# Patient Record
Sex: Female | Born: 1950 | Race: White | Hispanic: No | Marital: Married | Smoking: Former smoker
Health system: Southern US, Community
[De-identification: ages and names within clinical notes are randomized; demographics above are authoritative.]

## PROBLEM LIST (undated history)

## (undated) DIAGNOSIS — E119 Type 2 diabetes mellitus without complications: Secondary | ICD-10-CM

## (undated) DIAGNOSIS — H269 Unspecified cataract: Secondary | ICD-10-CM

## (undated) DIAGNOSIS — E785 Hyperlipidemia, unspecified: Secondary | ICD-10-CM

## (undated) DIAGNOSIS — B019 Varicella without complication: Secondary | ICD-10-CM

## (undated) DIAGNOSIS — D649 Anemia, unspecified: Secondary | ICD-10-CM

## (undated) DIAGNOSIS — M8580 Other specified disorders of bone density and structure, unspecified site: Secondary | ICD-10-CM

## (undated) DIAGNOSIS — R251 Tremor, unspecified: Secondary | ICD-10-CM

## (undated) DIAGNOSIS — M810 Age-related osteoporosis without current pathological fracture: Secondary | ICD-10-CM

## (undated) DIAGNOSIS — M1990 Unspecified osteoarthritis, unspecified site: Secondary | ICD-10-CM

## (undated) DIAGNOSIS — K219 Gastro-esophageal reflux disease without esophagitis: Secondary | ICD-10-CM

## (undated) HISTORY — PX: TUBAL LIGATION: SHX77

## (undated) HISTORY — DX: Type 2 diabetes mellitus without complications: E11.9

## (undated) HISTORY — DX: Gastro-esophageal reflux disease without esophagitis: K21.9

## (undated) HISTORY — DX: Unspecified cataract: H26.9

## (undated) HISTORY — DX: Unspecified osteoarthritis, unspecified site: M19.90

## (undated) HISTORY — DX: Hyperlipidemia, unspecified: E78.5

## (undated) HISTORY — DX: Tremor, unspecified: R25.1

## (undated) HISTORY — DX: Other specified disorders of bone density and structure, unspecified site: M85.80

## (undated) HISTORY — DX: Anemia, unspecified: D64.9

## (undated) HISTORY — DX: Age-related osteoporosis without current pathological fracture: M81.0

## (undated) HISTORY — DX: Varicella without complication: B01.9

---

## 1992-10-25 DIAGNOSIS — J189 Pneumonia, unspecified organism: Secondary | ICD-10-CM

## 1992-10-25 HISTORY — DX: Pneumonia, unspecified organism: J18.9

## 1999-03-27 ENCOUNTER — Ambulatory Visit (HOSPITAL_COMMUNITY): Admission: RE | Admit: 1999-03-27 | Discharge: 1999-03-27 | Payer: Self-pay | Admitting: Family Medicine

## 1999-04-09 ENCOUNTER — Encounter: Payer: Self-pay | Admitting: Family Medicine

## 1999-04-09 ENCOUNTER — Ambulatory Visit (HOSPITAL_COMMUNITY): Admission: RE | Admit: 1999-04-09 | Discharge: 1999-04-09 | Payer: Self-pay | Admitting: Family Medicine

## 1999-07-20 ENCOUNTER — Other Ambulatory Visit: Admission: RE | Admit: 1999-07-20 | Discharge: 1999-07-20 | Payer: Self-pay | Admitting: Family Medicine

## 1999-11-13 ENCOUNTER — Encounter: Payer: Self-pay | Admitting: Family Medicine

## 1999-11-13 ENCOUNTER — Ambulatory Visit (HOSPITAL_COMMUNITY): Admission: RE | Admit: 1999-11-13 | Discharge: 1999-11-13 | Payer: Self-pay | Admitting: Family Medicine

## 2000-11-08 ENCOUNTER — Other Ambulatory Visit: Admission: RE | Admit: 2000-11-08 | Discharge: 2000-11-08 | Payer: Self-pay | Admitting: Family Medicine

## 2000-11-23 ENCOUNTER — Ambulatory Visit (HOSPITAL_COMMUNITY): Admission: RE | Admit: 2000-11-23 | Discharge: 2000-11-23 | Payer: Self-pay | Admitting: Family Medicine

## 2000-11-23 ENCOUNTER — Encounter: Payer: Self-pay | Admitting: Family Medicine

## 2005-10-25 HISTORY — PX: COLONOSCOPY: SHX174

## 2005-10-25 LAB — HM COLONOSCOPY

## 2012-11-02 ENCOUNTER — Ambulatory Visit (INDEPENDENT_AMBULATORY_CARE_PROVIDER_SITE_OTHER): Payer: No Typology Code available for payment source | Admitting: Family Medicine

## 2012-11-02 ENCOUNTER — Encounter: Payer: Self-pay | Admitting: Family Medicine

## 2012-11-02 VITALS — BP 130/80 | HR 67 | Temp 97.9°F | Ht 68.75 in | Wt 213.0 lb

## 2012-11-02 DIAGNOSIS — I10 Essential (primary) hypertension: Secondary | ICD-10-CM | POA: Insufficient documentation

## 2012-11-02 DIAGNOSIS — M810 Age-related osteoporosis without current pathological fracture: Secondary | ICD-10-CM | POA: Insufficient documentation

## 2012-11-02 DIAGNOSIS — Z131 Encounter for screening for diabetes mellitus: Secondary | ICD-10-CM

## 2012-11-02 DIAGNOSIS — E785 Hyperlipidemia, unspecified: Secondary | ICD-10-CM

## 2012-11-02 DIAGNOSIS — M949 Disorder of cartilage, unspecified: Secondary | ICD-10-CM

## 2012-11-02 DIAGNOSIS — E119 Type 2 diabetes mellitus without complications: Secondary | ICD-10-CM

## 2012-11-02 DIAGNOSIS — M8580 Other specified disorders of bone density and structure, unspecified site: Secondary | ICD-10-CM

## 2012-11-02 LAB — BASIC METABOLIC PANEL
CO2: 30 mEq/L (ref 19–32)
Calcium: 9 mg/dL (ref 8.4–10.5)
Chloride: 101 mEq/L (ref 96–112)
Glucose, Bld: 103 mg/dL — ABNORMAL HIGH (ref 70–99)
Sodium: 138 mEq/L (ref 135–145)

## 2012-11-02 LAB — HEPATIC FUNCTION PANEL
ALT: 18 U/L (ref 0–35)
AST: 25 U/L (ref 0–37)
Alkaline Phosphatase: 41 U/L (ref 39–117)
Bilirubin, Direct: 0 mg/dL (ref 0.0–0.3)
Total Protein: 7.3 g/dL (ref 6.0–8.3)

## 2012-11-02 LAB — CBC WITH DIFFERENTIAL/PLATELET
Basophils Absolute: 0 10*3/uL (ref 0.0–0.1)
Eosinophils Absolute: 0.1 10*3/uL (ref 0.0–0.7)
Lymphocytes Relative: 30.5 % (ref 12.0–46.0)
MCHC: 33.5 g/dL (ref 30.0–36.0)
Monocytes Relative: 9.7 % (ref 3.0–12.0)
Neutrophils Relative %: 57 % (ref 43.0–77.0)
Platelets: 245 10*3/uL (ref 150.0–400.0)
RDW: 12.9 % (ref 11.5–14.6)

## 2012-11-02 LAB — LIPID PANEL
Total CHOL/HDL Ratio: 3
Triglycerides: 91 mg/dL (ref 0.0–149.0)

## 2012-11-02 LAB — TSH: TSH: 3.6 u[IU]/mL (ref 0.35–5.50)

## 2012-11-02 MED ORDER — PRAVASTATIN SODIUM 40 MG PO TABS: 40.0000 mg | ORAL_TABLET | Freq: Every day | ORAL | Status: DC

## 2012-11-02 MED ORDER — OMEPRAZOLE 20 MG PO CPDR: 20.0000 mg | DELAYED_RELEASE_CAPSULE | Freq: Every day | ORAL | Status: DC

## 2012-11-02 MED ORDER — VALSARTAN 80 MG PO TABS: 80.0000 mg | ORAL_TABLET | Freq: Every day | ORAL | Status: DC

## 2012-11-02 MED ORDER — METFORMIN HCL 1000 MG PO TABS: 1000.0000 mg | ORAL_TABLET | Freq: Two times a day (BID) | ORAL | Status: DC

## 2012-11-02 MED ORDER — ALENDRONATE SODIUM 70 MG PO TABS: 70.0000 mg | ORAL_TABLET | ORAL | Status: DC

## 2012-11-02 MED ORDER — ONETOUCH DELICA LANCETS 33G MISC: 1.0000 | Status: DC

## 2012-11-02 MED ORDER — GLUCOSE BLOOD VI STRP: ORAL_STRIP | Status: DC

## 2012-11-02 NOTE — Progress Notes (Signed)
  Subjective:    Patient ID: Theresa Eaton, female    DOB: 1951-03-08, 62 y.o.   MRN: [id number]  HPI New to establish.  Previous MD- Benedetto Goad, [place].  Last appt 03/07/12.  Health maintenance- last pap 2013, mammo 9/12  DM- chronic problem, on Metformin twice daily.  Last A1C 5.7 on 08/01/12.  Checking CBGs regularly- fastings recently ~120s.  No symptomatic lows.  UTD on eye exam.  No N/V/D.  No CP, SOB, HAs, visual changes, edema, numbness/tingling  HTN- chronic problem, on Diovan daily.  Asymptomatic.  Hyperlipidemia- chronic problem, on Pravastatin nightly.  Denies abd pain, N/V, myalgias.  Labs done in May.  Osteopenia- BMD decreased from -1 to -1.5 in 5 yr period (last DEXA 2012).  Now on Fosamax weekly.  Due for repeat DEXA this Feb.    Review of Systems For ROS see HPI     Objective:   Physical Exam  Vitals reviewed. Constitutional: She is oriented to person, place, and time. She appears well-developed and well-nourished. No distress.  HENT:  Head: Normocephalic and atraumatic.  Eyes: Conjunctivae normal and EOM are normal. Pupils are equal, round, and reactive to light.  Neck: Normal range of motion. Neck supple. No thyromegaly present.  Cardiovascular: Normal rate, regular rhythm, normal heart sounds and intact distal pulses.   No murmur heard. Pulmonary/Chest: Effort normal and breath sounds normal. No respiratory distress.  Abdominal: Soft. She exhibits no distension. There is no tenderness.  Musculoskeletal: She exhibits no edema.  Lymphadenopathy:    She has no cervical adenopathy.  Neurological: She is alert and oriented to person, place, and time.  Skin: Skin is warm and dry.  Psychiatric: She has a normal mood and affect. Her behavior is normal.          Assessment & Plan:

## 2012-11-02 NOTE — Patient Instructions (Addendum)
Schedule your complete physical for May We'll notify you of your lab results and make any changes if needed Call with any questions or concerns Welcome!  We're glad to have you!

## 2012-11-05 LAB — VITAMIN D 1,25 DIHYDROXY
Vitamin D 1, 25 (OH)2 Total: 51 pg/mL (ref 18–72)
Vitamin D2 1, 25 (OH)2: <8 pg/mL
Vitamin D3 1, 25 (OH)2: 51 pg/mL

## 2012-11-06 ENCOUNTER — Encounter: Payer: Self-pay | Admitting: *Deleted

## 2012-11-07 ENCOUNTER — Encounter: Payer: Self-pay | Admitting: Family Medicine

## 2012-11-07 DIAGNOSIS — R739 Hyperglycemia, unspecified: Secondary | ICD-10-CM | POA: Insufficient documentation

## 2012-11-07 DIAGNOSIS — Z8639 Personal history of other endocrine, nutritional and metabolic disease: Secondary | ICD-10-CM | POA: Insufficient documentation

## 2012-11-07 NOTE — Assessment & Plan Note (Signed)
New to provider, ongoing for pt.  On Fosamax.  Check Vit D.  Pt due for DEXA after Feb this year.  Will order at upcoming CPE.  Pt expressed understanding and is in agreement w/ plan.

## 2012-11-07 NOTE — Assessment & Plan Note (Signed)
New to provider, chronic for pt.  Tolerating statin w/out difficulty.  Check labs.  Adjust meds prn

## 2012-11-07 NOTE — Assessment & Plan Note (Signed)
Wrong diagnosis

## 2012-11-07 NOTE — Assessment & Plan Note (Signed)
New to provider, chronic for pt.  Tolerating meds, BP adequately controlled.  Asymptomatic.  Check labs.  No anticipated changes.

## 2012-11-07 NOTE — Assessment & Plan Note (Signed)
New to provider, ongoing for pt.  Tolerating meds w/out difficulty.  Last A1C was excellent.  Asymptomatic.  UTD on eye exam.  Check labs.  Adjust meds prn.

## 2012-12-09 ENCOUNTER — Other Ambulatory Visit: Payer: Self-pay

## 2013-01-02 ENCOUNTER — Telehealth: Payer: Self-pay | Admitting: Family Medicine

## 2013-01-02 NOTE — Telephone Encounter (Signed)
Plan listed at last OV was to order at time of upcoming CPE

## 2013-01-02 NOTE — Telephone Encounter (Signed)
Please advise on note.  Pt has an appt scheduled on 03-09-13.   Do you want me to go ahead and schedule the mammogram and BDS?//AB/CMA

## 2013-01-02 NOTE — Telephone Encounter (Signed)
Patient states that she had talked to Dr. Beverely Low about getting a mammogram and bone density test done, but says that no one has called her regarding this.

## 2013-01-02 NOTE — Telephone Encounter (Signed)
LM @ (5:07pm) asking the pt to RTC.//AB/CMA

## 2013-01-05 NOTE — Telephone Encounter (Signed)
Lm @ (11:33am) asking the pt to RTC.//AB/CMA

## 2013-01-05 NOTE — Telephone Encounter (Signed)
LM @ (8:25am) asking the pt to RTC.//AB/CMA

## 2013-01-09 ENCOUNTER — Telehealth: Payer: Self-pay | Admitting: *Deleted

## 2013-01-09 NOTE — Telephone Encounter (Signed)
Spoke with the pt on (01-09-13) and informed her that Dr. Beverely Low plan is to order the Mammogram and BDS at the time of her upcoming CPE.  Pt agreed.//AB/CMA

## 2013-01-09 NOTE — Telephone Encounter (Signed)
Received call on (01-05-13) from Graciela Husbands with [facility] regarding changing the pt's BP med.  She stated that the pt's ins has an preferrred plan drug list and her Diovan is not on the list.  The meds that are on the list are: Losartan,Irbesartan,Eprosartan.  Verbally informed Dr. Beverely Low of the call regarding the med change.  Dr. Beverely Low requested to change from Diovan to Losartan 100mg  1 daily.  Informed Graciela Husbands at [employer] of this change.  Also called the pt and explained to her that we received a call from [facility] regarding changing her Diovan to another med, and explained to her the new med will be Losartan 100mg  and she's to take it 1 daily.  Also told her she will need to f/u with Dr. Beverely Low in 1 mos to see how the Losartan 100mg  is working.  Pt understood and agreed.//AB/CMA

## 2013-03-09 ENCOUNTER — Ambulatory Visit (INDEPENDENT_AMBULATORY_CARE_PROVIDER_SITE_OTHER): Payer: No Typology Code available for payment source | Admitting: Family Medicine

## 2013-03-09 ENCOUNTER — Encounter: Payer: Self-pay | Admitting: Family Medicine

## 2013-03-09 VITALS — BP 120/80 | HR 63 | Temp 98.1°F | Ht 68.75 in | Wt 212.2 lb

## 2013-03-09 DIAGNOSIS — E119 Type 2 diabetes mellitus without complications: Secondary | ICD-10-CM

## 2013-03-09 DIAGNOSIS — M899 Disorder of bone, unspecified: Secondary | ICD-10-CM

## 2013-03-09 DIAGNOSIS — M8580 Other specified disorders of bone density and structure, unspecified site: Secondary | ICD-10-CM

## 2013-03-09 DIAGNOSIS — Z1211 Encounter for screening for malignant neoplasm of colon: Secondary | ICD-10-CM

## 2013-03-09 DIAGNOSIS — Z1231 Encounter for screening mammogram for malignant neoplasm of breast: Secondary | ICD-10-CM

## 2013-03-09 DIAGNOSIS — Z0000 Encounter for general adult medical examination without abnormal findings: Secondary | ICD-10-CM

## 2013-03-09 DIAGNOSIS — Z1331 Encounter for screening for depression: Secondary | ICD-10-CM

## 2013-03-09 LAB — CBC WITH DIFFERENTIAL/PLATELET
Basophils Absolute: 0 10*3/uL (ref 0.0–0.1)
Eosinophils Absolute: 0.1 10*3/uL (ref 0.0–0.7)
HCT: 37.5 % (ref 36.0–46.0)
Lymphs Abs: 1.9 10*3/uL (ref 0.7–4.0)
MCHC: 33.8 g/dL (ref 30.0–36.0)
MCV: 89 fl (ref 78.0–100.0)
Monocytes Absolute: 0.5 10*3/uL (ref 0.1–1.0)
Neutrophils Relative %: 51.1 % (ref 43.0–77.0)
Platelets: 246 10*3/uL (ref 150.0–400.0)
RDW: 13.5 % (ref 11.5–14.6)
WBC: 5.2 10*3/uL (ref 4.5–10.5)

## 2013-03-09 LAB — HEPATIC FUNCTION PANEL
AST: 19 U/L (ref 0–37)
Albumin: 3.8 g/dL (ref 3.5–5.2)
Alkaline Phosphatase: 43 U/L (ref 39–117)
Bilirubin, Direct: 0.1 mg/dL (ref 0.0–0.3)
Total Bilirubin: 0.6 mg/dL (ref 0.3–1.2)

## 2013-03-09 LAB — BASIC METABOLIC PANEL
Calcium: 8.8 mg/dL (ref 8.4–10.5)
GFR: 92.54 mL/min (ref >60.00)
Glucose, Bld: 90 mg/dL (ref 70–99)
Potassium: 3.7 mEq/L (ref 3.5–5.1)
Sodium: 139 mEq/L (ref 135–145)

## 2013-03-09 LAB — LIPID PANEL
HDL: 59.4 mg/dL (ref >39.00)
Total CHOL/HDL Ratio: 3
Triglycerides: 99 mg/dL (ref 0.0–149.0)
VLDL: 19.8 mg/dL (ref 0.0–40.0)

## 2013-03-09 NOTE — Patient Instructions (Addendum)
Follow up in 3-4 months to recheck diabetes Keep up the good work!  You look great! We'll notify you of your lab results and make any changes if needed We'll call you with your mammo and bone density appts Someone will call you with your GI appt Call with any questions or concerns Have a great summer!

## 2013-03-09 NOTE — Progress Notes (Signed)
  Subjective:    Patient ID: Theresa Eaton, female    DOB: 04/17/1951, 62 y.o.   MRN: [id number]  HPI CPE- pt unclear on date of last colonoscopy.  Due for DEXA, mammo.  UTD pap.  No concerns today.  UTD on eye exam.   Review of Systems Patient reports no vision/ hearing changes, adenopathy,fever, weight change,  persistant/recurrent hoarseness , swallowing issues, chest pain, palpitations, edema, persistant/recurrent cough, hemoptysis, dyspnea (rest/exertional/paroxysmal nocturnal), gastrointestinal bleeding (melena, rectal bleeding), abdominal pain, significant heartburn, bowel changes, GU symptoms (dysuria, hematuria, incontinence), Gyn symptoms (abnormal  bleeding, pain),  syncope, focal weakness, memory loss, numbness & tingling, skin/hair/nail changes, abnormal bruising or bleeding, anxiety, or depression.     Objective:   Physical Exam  General Appearance:    Alert, cooperative, no distress, appears stated age  Head:    Normocephalic, without obvious abnormality, atraumatic  Eyes:    PERRL, conjunctiva/corneas clear, EOM's intact, fundi    benign, both eyes  Ears:    Normal TM's and external ear canals, both ears  Nose:   Nares normal, septum midline, mucosa normal, no drainage    or sinus tenderness  Throat:   Lips, mucosa, and tongue normal; teeth and gums normal  Neck:   Supple, symmetrical, trachea midline, no adenopathy;    Thyroid: no enlargement/tenderness/nodules  Back:     Symmetric, no curvature, ROM normal, no CVA tenderness  Lungs:     Clear to auscultation bilaterally, respirations unlabored  Chest Wall:    No tenderness or deformity   Heart:    Regular rate and rhythm, S1 and S2 normal, no murmur, rub   or gallop  Breast Exam:    No tenderness, masses, or nipple abnormality  Abdomen:     Soft, non-tender, bowel sounds active all four quadrants,    no masses, no organomegaly  Genitalia:    deferred  Rectal:    Extremities:   Extremities normal, atraumatic, no  cyanosis or edema  Pulses:   2+ and symmetric all extremities  Skin:   Skin color, texture, turgor normal, no rashes or lesions  Lymph nodes:   Cervical, supraclavicular, and axillary nodes normal  Neurologic:   CNII-XII intact, normal strength, sensation and reflexes    throughout          Assessment & Plan:

## 2013-03-11 NOTE — Assessment & Plan Note (Signed)
Check Vit D level, get DEXA

## 2013-03-11 NOTE — Assessment & Plan Note (Signed)
Pt's PE WNL.  Due for mammo, DEXA and likely colonoscopy.  Referrals made.  EKG done- see document for interpretation.  Anticipatory guidance provided.

## 2013-03-11 NOTE — Assessment & Plan Note (Signed)
Chronic problem.  Due for A1C.  Adjust meds prn.

## 2013-03-12 ENCOUNTER — Encounter: Payer: Self-pay | Admitting: Internal Medicine

## 2013-03-13 ENCOUNTER — Encounter: Payer: Self-pay | Admitting: Family Medicine

## 2013-03-14 LAB — VITAMIN D 1,25 DIHYDROXY
Vitamin D 1, 25 (OH)2 Total: 24 pg/mL (ref 18–72)
Vitamin D2 1, 25 (OH)2: <8 pg/mL

## 2013-04-05 ENCOUNTER — Encounter: Payer: Self-pay | Admitting: Family Medicine

## 2013-04-05 MED ORDER — LOSARTAN POTASSIUM 50 MG PO TABS: 50.0000 mg | ORAL_TABLET | Freq: Every day | ORAL | Status: DC

## 2013-04-05 NOTE — Addendum Note (Signed)
Addended by: Jackson Latino on: 04/05/2013 03:24 PM   Modules accepted: Orders

## 2013-04-05 NOTE — Telephone Encounter (Signed)
Med filled for Losartan 50mg  1 tab daily per Dr. Beverely Low. [facility] to [facility].

## 2013-04-05 NOTE — Telephone Encounter (Signed)
Please advise if she should be on losartan or does she mean the pravastatin?

## 2013-04-06 ENCOUNTER — Ambulatory Visit (INDEPENDENT_AMBULATORY_CARE_PROVIDER_SITE_OTHER)
Admission: RE | Admit: 2013-04-06 | Discharge: 2013-04-06 | Disposition: A | Discharge status: Home / Self Care | Payer: No Typology Code available for payment source | Source: Ambulatory Visit | Attending: Family Medicine | Admitting: Family Medicine

## 2013-04-06 DIAGNOSIS — M949 Disorder of cartilage, unspecified: Secondary | ICD-10-CM

## 2013-04-06 DIAGNOSIS — M8580 Other specified disorders of bone density and structure, unspecified site: Secondary | ICD-10-CM

## 2013-04-09 ENCOUNTER — Encounter: Payer: Self-pay | Admitting: Family Medicine

## 2013-05-03 ENCOUNTER — Encounter: Payer: No Typology Code available for payment source | Admitting: Internal Medicine

## 2013-06-04 ENCOUNTER — Encounter: Payer: Self-pay | Admitting: Family Medicine

## 2013-07-06 LAB — HM MAMMOGRAPHY: HM Mammogram: NORMAL

## 2013-07-17 ENCOUNTER — Encounter: Payer: Self-pay | Admitting: *Deleted

## 2013-08-18 ENCOUNTER — Other Ambulatory Visit: Payer: Self-pay | Admitting: Family Medicine

## 2013-08-18 NOTE — Telephone Encounter (Signed)
Med filled.  

## 2013-08-30 ENCOUNTER — Other Ambulatory Visit: Payer: Self-pay

## 2013-09-15 ENCOUNTER — Encounter: Payer: Self-pay | Admitting: Family Medicine

## 2013-09-15 ENCOUNTER — Other Ambulatory Visit: Payer: Self-pay | Admitting: Family Medicine

## 2013-09-17 NOTE — Telephone Encounter (Signed)
Med filled.  

## 2013-10-18 ENCOUNTER — Other Ambulatory Visit: Payer: Self-pay | Admitting: Family Medicine

## 2013-10-19 NOTE — Telephone Encounter (Signed)
Med filled.  

## 2013-10-25 HISTORY — PX: OTHER SURGICAL HISTORY: SHX169

## 2013-10-29 ENCOUNTER — Other Ambulatory Visit: Payer: Self-pay | Admitting: Family Medicine

## 2013-12-08 ENCOUNTER — Other Ambulatory Visit: Payer: Self-pay | Admitting: Family Medicine

## 2013-12-08 NOTE — Telephone Encounter (Signed)
Med filled.  

## 2013-12-29 ENCOUNTER — Other Ambulatory Visit: Payer: Self-pay | Admitting: Family Medicine

## 2013-12-31 NOTE — Telephone Encounter (Signed)
Med filled.  

## 2014-01-17 ENCOUNTER — Ambulatory Visit (INDEPENDENT_AMBULATORY_CARE_PROVIDER_SITE_OTHER): Payer: Medicare PPO | Admitting: Family Medicine

## 2014-01-17 ENCOUNTER — Encounter: Payer: Self-pay | Admitting: Family Medicine

## 2014-01-17 VITALS — BP 142/70 | HR 62 | Temp 97.8°F | Ht 69.0 in | Wt 201.8 lb

## 2014-01-17 DIAGNOSIS — I10 Essential (primary) hypertension: Secondary | ICD-10-CM

## 2014-01-17 DIAGNOSIS — M899 Disorder of bone, unspecified: Secondary | ICD-10-CM

## 2014-01-17 DIAGNOSIS — M8580 Other specified disorders of bone density and structure, unspecified site: Secondary | ICD-10-CM

## 2014-01-17 DIAGNOSIS — E119 Type 2 diabetes mellitus without complications: Secondary | ICD-10-CM

## 2014-01-17 DIAGNOSIS — E785 Hyperlipidemia, unspecified: Secondary | ICD-10-CM

## 2014-01-17 DIAGNOSIS — M949 Disorder of cartilage, unspecified: Secondary | ICD-10-CM

## 2014-01-17 NOTE — Assessment & Plan Note (Signed)
Can stop ARB is BP remains at goal.  D/w pt.  She agrees.  Return for fasting labs.

## 2014-01-17 NOTE — Assessment & Plan Note (Signed)
Stop statin, return for labs.

## 2014-01-17 NOTE — Progress Notes (Signed)
Pre visit review using our clinic review tool, if applicable. No additional management support is needed unless otherwise documented below in the visit note.  Diabetes:  Off meds.   Hypoglycemic episodes:no Hyperglycemic episodes:no Feet problems:no Blood Sugars averaging: ~100, improved with diet and exercise, weight loss.  Due for labs.  She may not have DM2 now, discussed.   Hypertension:    Using medication without problems or lightheadedness: yes Chest pain with exertion:no Edema:no Short of breath:no BP usually much lower out of office, ~110/60s.  See above re: weight.   Elevated Cholesterol: Using medications without problems:no- sensation of leg weakness on med Muscle aches: no Diet compliance:yes Exercise:yes  PMH and SH reviewed.   Vital signs, Meds and allergies reviewed.  ROS: See HPI.  Otherwise nontributory.   GEN: nad, alert and oriented HEENT: mucous membranes moist NECK: supple w/o LA CV: rrr.  no murmur PULM: ctab, no inc wob ABD: soft, +bs EXT: no edema SKIN: no acute rash

## 2014-01-17 NOTE — Patient Instructions (Signed)
Stop the pravastatin for now and stay off Prilosec and metformin.  If your BP stays low, then stop the losartan.  Recheck fasting labs in about 2 weeks at the [facility] [address].  Take care.

## 2014-01-17 NOTE — Assessment & Plan Note (Signed)
Will check A1c, she may not have Dm2 at this point.  D/w pt.  Continue work on [employer] and diet.  Off meds for now.

## 2014-01-29 ENCOUNTER — Telehealth: Payer: Self-pay

## 2014-01-29 NOTE — Telephone Encounter (Signed)
Relevant patient education assigned to patient using Emmi. ° °

## 2014-02-04 ENCOUNTER — Other Ambulatory Visit (INDEPENDENT_AMBULATORY_CARE_PROVIDER_SITE_OTHER): Payer: Medicare PPO

## 2014-02-04 ENCOUNTER — Encounter: Payer: Self-pay | Admitting: Family Medicine

## 2014-02-04 DIAGNOSIS — E119 Type 2 diabetes mellitus without complications: Secondary | ICD-10-CM

## 2014-02-04 DIAGNOSIS — M8580 Other specified disorders of bone density and structure, unspecified site: Secondary | ICD-10-CM

## 2014-02-04 LAB — COMPREHENSIVE METABOLIC PANEL
ALK PHOS: 48 U/L (ref 39–117)
ALT: 14 U/L (ref 0–35)
AST: 20 U/L (ref 0–37)
Albumin: 3.7 g/dL (ref 3.5–5.2)
BILIRUBIN TOTAL: 0.4 mg/dL (ref 0.3–1.2)
BUN: 14 mg/dL (ref 6–23)
CO2: 30 mEq/L (ref 19–32)
CREATININE: 0.8 mg/dL (ref 0.4–1.2)
Calcium: 9 mg/dL (ref 8.4–10.5)
Chloride: 103 mEq/L (ref 96–112)
GFR: 78.76 mL/min (ref >60.00)
GLUCOSE: 87 mg/dL (ref 70–99)
Potassium: 4 mEq/L (ref 3.5–5.1)
Sodium: 140 mEq/L (ref 135–145)
Total Protein: 6.9 g/dL (ref 6.0–8.3)

## 2014-02-04 LAB — LIPID PANEL
CHOL/HDL RATIO: 4
Cholesterol: 230 mg/dL — ABNORMAL HIGH (ref 0–200)
HDL: 61.2 mg/dL (ref >39.00)
LDL Cholesterol: 152 mg/dL — ABNORMAL HIGH (ref 0–99)
Triglycerides: 84 mg/dL (ref 0.0–149.0)
VLDL: 16.8 mg/dL (ref 0.0–40.0)

## 2014-02-04 LAB — HEMOGLOBIN A1C: Hgb A1c MFr Bld: 6.1 % (ref 4.6–6.5)

## 2014-02-05 ENCOUNTER — Encounter: Payer: Self-pay | Admitting: *Deleted

## 2014-02-05 ENCOUNTER — Other Ambulatory Visit: Payer: Self-pay | Admitting: *Deleted

## 2014-02-05 LAB — VITAMIN D 25 HYDROXY (VIT D DEFICIENCY, FRACTURES): Vit D, 25-Hydroxy: 49 ng/mL (ref 30–89)

## 2014-02-05 MED ORDER — ALENDRONATE SODIUM 70 MG PO TABS: ORAL_TABLET | ORAL | Status: DC

## 2014-08-28 ENCOUNTER — Telehealth: Payer: Self-pay | Admitting: Family Medicine

## 2014-08-28 DIAGNOSIS — Z8639 Personal history of other endocrine, nutritional and metabolic disease: Secondary | ICD-10-CM

## 2014-08-28 NOTE — Telephone Encounter (Signed)
Ordered.  Thanks.  All she needs is a lipid and A1c.

## 2014-08-28 NOTE — Telephone Encounter (Signed)
Patient is having her wellness exam on 09/16/14.  Patient wants to have [facility] lab work done at [facility].  Please put in orders for her lab work.

## 2014-08-28 NOTE — Addendum Note (Signed)
Addended by: Joaquim NamUNCAN, Sherline Eberwein S on: 08/28/2014 11:38 AM   Modules accepted: Orders

## 2014-08-28 NOTE — Telephone Encounter (Signed)
Patient notified by telephone that orders are in. 

## 2014-08-29 ENCOUNTER — Encounter: Payer: Self-pay | Admitting: Family Medicine

## 2014-09-09 ENCOUNTER — Other Ambulatory Visit (INDEPENDENT_AMBULATORY_CARE_PROVIDER_SITE_OTHER): Payer: Medicare PPO

## 2014-09-09 DIAGNOSIS — E785 Hyperlipidemia, unspecified: Secondary | ICD-10-CM

## 2014-09-09 DIAGNOSIS — Z8639 Personal history of other endocrine, nutritional and metabolic disease: Secondary | ICD-10-CM

## 2014-09-09 LAB — LIPID PANEL
CHOL/HDL RATIO: 4
Cholesterol: 233 mg/dL — ABNORMAL HIGH (ref 0–200)
HDL: 56.5 mg/dL (ref >39.00)
LDL CALC: 161 mg/dL — AB (ref 0–99)
NonHDL: 176.5
Triglycerides: 76 mg/dL (ref 0.0–149.0)
VLDL: 15.2 mg/dL (ref 0.0–40.0)

## 2014-09-09 LAB — HEMOGLOBIN A1C: Hgb A1c MFr Bld: 6 % (ref 4.6–6.5)

## 2014-09-16 ENCOUNTER — Ambulatory Visit (INDEPENDENT_AMBULATORY_CARE_PROVIDER_SITE_OTHER): Payer: Medicare PPO | Admitting: Family Medicine

## 2014-09-16 ENCOUNTER — Encounter: Payer: Self-pay | Admitting: Family Medicine

## 2014-09-16 VITALS — BP 110/68 | HR 78 | Temp 98.6°F | Ht 68.25 in | Wt 198.5 lb

## 2014-09-16 DIAGNOSIS — M8580 Other specified disorders of bone density and structure, unspecified site: Secondary | ICD-10-CM

## 2014-09-16 DIAGNOSIS — I10 Essential (primary) hypertension: Secondary | ICD-10-CM

## 2014-09-16 DIAGNOSIS — Z7189 Other specified counseling: Secondary | ICD-10-CM

## 2014-09-16 DIAGNOSIS — Z0000 Encounter for general adult medical examination without abnormal findings: Secondary | ICD-10-CM

## 2014-09-16 DIAGNOSIS — H6980 Other specified disorders of Eustachian tube, unspecified ear: Secondary | ICD-10-CM

## 2014-09-16 DIAGNOSIS — E785 Hyperlipidemia, unspecified: Secondary | ICD-10-CM

## 2014-09-16 DIAGNOSIS — Z8639 Personal history of other endocrine, nutritional and metabolic disease: Secondary | ICD-10-CM

## 2014-09-16 DIAGNOSIS — Z23 Encounter for immunization: Secondary | ICD-10-CM

## 2014-09-16 MED ORDER — LOSARTAN POTASSIUM 50 MG PO TABS: 25.0000 mg | ORAL_TABLET | Freq: Every day | ORAL | Status: DC

## 2014-09-16 NOTE — Patient Instructions (Signed)
Take 25-50mg  of losartan.  If BP isn't controlled on the lower dose, then go back to 50mg  a day.  Keep exercising.  Glad to see you.  Gently try to pop your ears and use nasal saline if needed.  If still not better, then try OTC flonase 2 sprays in each nostril once a day.  Happy thanksgiving.

## 2014-09-16 NOTE — Progress Notes (Signed)
Pre visit review using our clinic review tool, if applicable. No additional management support is needed unless otherwise documented below in the visit note.  I have personally reviewed the Medicare Annual Wellness questionnaire and have noted 1. The patient's medical and social history 2. Their use of alcohol, tobacco or illicit drugs 3. Their current medications and supplements 4. The patient's functional ability including ADL's, fall risks, home safety risks and hearing or visual             impairment. 5. Diet and physical activities 6. Evidence for depression or mood disorders  The patients weight, height, BMI have been recorded in the chart and visual acuity is per eye clinic.  I have made referrals, counseling and provided education to the patient based review of the above and I have provided the pt with a written personalized care plan for preventive services.  Provider list updated- see scanned forms.  Routine anticipatory guidance given to patient.  See health maintenance.  Flu 2015 Shingles 2004 PNA 2015 Tetanus 2014 Colon 2007 Breast cancer screening up to date, mammogram 2015 Advance directive- daughter Majel Homer(Shannon Baker) designated if patient were incapacitated.  Cognitive function addressed- see scanned forms- and if abnormal then additional documentation follows.  DXA 2014  R cerumen and ETD.  She tried otc debrox w/o relief.  Asking for eval today.   Hypertension:    Using medication without problems or lightheadedness: yes Chest pain with exertion:no Edema:no Short of breath:no She walked 329 miles on a trip in [place] earlier this year.   History of DM2, not now based on labs.   HLD.  Statin intolerant.   Labs d/w pt.  Not a statin candidate.   PMH and SH reviewed  Meds, vitals, and allergies reviewed.   ROS: See HPI.  Otherwise negative.    GEN: nad, alert and oriented HEENT: mucous membranes moist, R cerumen impaction resolved with irrigation but B ETD  persists.   NECK: supple w/o LA CV: rrr. PULM: ctab, no inc wob ABD: soft, +bs EXT: no edema SKIN: no acute rash

## 2014-09-18 ENCOUNTER — Encounter: Payer: Self-pay | Admitting: Family Medicine

## 2014-09-18 DIAGNOSIS — H6980 Other specified disorders of Eustachian tube, unspecified ear: Secondary | ICD-10-CM | POA: Insufficient documentation

## 2014-09-18 DIAGNOSIS — Z7189 Other specified counseling: Secondary | ICD-10-CM | POA: Insufficient documentation

## 2014-09-18 DIAGNOSIS — Z0000 Encounter for general adult medical examination without abnormal findings: Secondary | ICD-10-CM | POA: Insufficient documentation

## 2014-09-18 NOTE — Assessment & Plan Note (Signed)
Gentle valsalva and use nasal saline if needed. If still not better, then she'll try OTC flonase 2 sprays in each nostril once a day.  F/u prn.

## 2014-09-18 NOTE — Assessment & Plan Note (Signed)
Flu 2015  Shingles 2004  PNA 2015  Tetanus 2014  Colon 2007  Breast cancer screening up to date, mammogram 2015  Advance directive- daughter Majel Homer(Shannon Baker) designated if patient were incapacitated.  Cognitive function addressed- see scanned forms- and if abnormal then additional documentation follows.  DXA 2014

## 2014-09-18 NOTE — Assessment & Plan Note (Signed)
Resolved with diet and execise.  D/w pt.

## 2014-09-18 NOTE — Assessment & Plan Note (Signed)
Statin intolerant, continue diet and exercise.

## 2014-09-18 NOTE — Assessment & Plan Note (Signed)
Advance directive- daughter (Shannon Baker) designated if patient were incapacitated.  

## 2014-09-18 NOTE — Assessment & Plan Note (Signed)
She wanted to try to get off BP meds.  Would cut ARB back to 25mg  and she'll check her BP.  Notify me if needed.  She agrees.

## 2014-09-23 ENCOUNTER — Encounter: Payer: Self-pay | Admitting: Family Medicine

## 2014-09-23 NOTE — Progress Notes (Signed)
Order(s) created erroneously. Erroneous order ID: [id number]  Order moved by: Ian MalkinEVERHART, Toma Arts F  Order move date/time: 09/23/2014 2:24 PM  Source Patient: [id number]  Source Contact: 01/17/2014  Destination Patient: [id number]  Destination Contact: 01/09/2013

## 2014-09-23 NOTE — Progress Notes (Signed)
Order(s) created erroneously. Erroneous order ID: [id number]  Order moved by: Ian MalkinEVERHART, Kalan Rinn F  Order move date/time: 09/23/2014 3:03 PM  Source Patient: [id number]  Source Contact: 09/23/2014  Destination Patient: [id number]  Destination Contact: 01/09/2013

## 2015-01-28 ENCOUNTER — Other Ambulatory Visit: Payer: Self-pay | Admitting: *Deleted

## 2015-01-28 MED ORDER — ALENDRONATE SODIUM 70 MG PO TABS: 70.0000 mg | ORAL_TABLET | ORAL | Status: DC

## 2015-08-08 DIAGNOSIS — Z23 Encounter for immunization: Secondary | ICD-10-CM | POA: Diagnosis not present

## 2015-10-20 ENCOUNTER — Other Ambulatory Visit: Payer: Self-pay | Admitting: Family Medicine

## 2015-10-21 NOTE — Telephone Encounter (Signed)
Electronic refill request.  Last Filled:    12 tablet 3 01/28/2015  Last CPE 09/16/14.  No upcoming appt scheduled. Please advise.

## 2015-10-22 NOTE — Telephone Encounter (Signed)
Needs CPE scheduled.  Sent. Thanks.   

## 2015-10-22 NOTE — Telephone Encounter (Signed)
Patient advised.

## 2015-11-13 ENCOUNTER — Telehealth: Payer: Self-pay | Admitting: Family Medicine

## 2015-11-13 DIAGNOSIS — I10 Essential (primary) hypertension: Secondary | ICD-10-CM

## 2015-11-13 DIAGNOSIS — M8580 Other specified disorders of bone density and structure, unspecified site: Secondary | ICD-10-CM

## 2015-11-13 NOTE — Telephone Encounter (Signed)
Orders are in   Thanks

## 2015-11-13 NOTE — Telephone Encounter (Signed)
Patient is coming in for her physical on 01/27/16.  Patient wants to have her lab work done at the [facility].  Please put order in computer.  She will be going to have her labs done around 01/08/16.

## 2015-11-13 NOTE — Telephone Encounter (Signed)
Patient advised.

## 2015-11-18 DIAGNOSIS — Z139 Encounter for screening, unspecified: Secondary | ICD-10-CM | POA: Diagnosis not present

## 2015-11-18 DIAGNOSIS — Z1231 Encounter for screening mammogram for malignant neoplasm of breast: Secondary | ICD-10-CM | POA: Diagnosis not present

## 2015-11-18 LAB — HM MAMMOGRAPHY: HM Mammogram: NORMAL

## 2015-11-25 ENCOUNTER — Encounter: Payer: Self-pay | Admitting: Family Medicine

## 2015-12-02 ENCOUNTER — Other Ambulatory Visit: Payer: Self-pay | Admitting: Family Medicine

## 2015-12-02 DIAGNOSIS — M8580 Other specified disorders of bone density and structure, unspecified site: Secondary | ICD-10-CM

## 2015-12-03 NOTE — Telephone Encounter (Signed)
See refill request sent electronically from pharmacy Last office visit 09/16/14, has upcoming appointment 01/27/16 Is it okay to refill until appointment?

## 2015-12-04 ENCOUNTER — Encounter: Payer: Self-pay | Admitting: Family Medicine

## 2015-12-04 ENCOUNTER — Other Ambulatory Visit: Payer: Self-pay | Admitting: *Deleted

## 2015-12-04 NOTE — Telephone Encounter (Signed)
Left detailed information on VM

## 2015-12-04 NOTE — Telephone Encounter (Signed)
She started fosamax in 2012.  I wouldn't take for more than 5 years.  We can discuss at OV.  Thanks.

## 2015-12-05 ENCOUNTER — Other Ambulatory Visit: Payer: Self-pay | Admitting: Family Medicine

## 2015-12-05 MED ORDER — ACETAZOLAMIDE 125 MG PO TABS: 125.0000 mg | ORAL_TABLET | Freq: Two times a day (BID) | ORAL | Status: DC

## 2016-01-08 ENCOUNTER — Other Ambulatory Visit (INDEPENDENT_AMBULATORY_CARE_PROVIDER_SITE_OTHER): Payer: Medicare PPO

## 2016-01-08 DIAGNOSIS — M8580 Other specified disorders of bone density and structure, unspecified site: Secondary | ICD-10-CM

## 2016-01-08 DIAGNOSIS — I10 Essential (primary) hypertension: Secondary | ICD-10-CM | POA: Diagnosis not present

## 2016-01-08 LAB — COMPREHENSIVE METABOLIC PANEL
ALT: 14 U/L (ref 0–35)
AST: 20 U/L (ref 0–37)
Albumin: 4.2 g/dL (ref 3.5–5.2)
Alkaline Phosphatase: 45 U/L (ref 39–117)
BUN: 14 mg/dL (ref 6–23)
CO2: 33 meq/L — AB (ref 19–32)
Calcium: 9.8 mg/dL (ref 8.4–10.5)
Chloride: 103 mEq/L (ref 96–112)
Creatinine, Ser: 0.82 mg/dL (ref 0.40–1.20)
GFR: 73.91 mL/min (ref >60.00)
GLUCOSE: 108 mg/dL — AB (ref 70–99)
POTASSIUM: 4.4 meq/L (ref 3.5–5.1)
SODIUM: 141 meq/L (ref 135–145)
TOTAL PROTEIN: 7.2 g/dL (ref 6.0–8.3)
Total Bilirubin: 0.7 mg/dL (ref 0.2–1.2)

## 2016-01-08 LAB — LIPID PANEL
Cholesterol: 229 mg/dL — ABNORMAL HIGH (ref 0–200)
HDL: 56.3 mg/dL (ref >39.00)
LDL Cholesterol: 151 mg/dL — ABNORMAL HIGH (ref 0–99)
NONHDL: 173.03
Total CHOL/HDL Ratio: 4
Triglycerides: 108 mg/dL (ref 0.0–149.0)
VLDL: 21.6 mg/dL (ref 0.0–40.0)

## 2016-01-08 LAB — VITAMIN D 25 HYDROXY (VIT D DEFICIENCY, FRACTURES): VITD: 32.13 ng/mL (ref 30.00–100.00)

## 2016-01-27 ENCOUNTER — Other Ambulatory Visit: Payer: Self-pay | Admitting: Family Medicine

## 2016-01-27 ENCOUNTER — Ambulatory Visit (INDEPENDENT_AMBULATORY_CARE_PROVIDER_SITE_OTHER): Payer: Medicare Other | Admitting: Family Medicine

## 2016-01-27 ENCOUNTER — Encounter: Payer: Self-pay | Admitting: Family Medicine

## 2016-01-27 VITALS — BP 114/74 | HR 67 | Temp 98.7°F | Ht 68.0 in | Wt 201.5 lb

## 2016-01-27 DIAGNOSIS — Z0000 Encounter for general adult medical examination without abnormal findings: Secondary | ICD-10-CM | POA: Diagnosis not present

## 2016-01-27 DIAGNOSIS — Z119 Encounter for screening for infectious and parasitic diseases, unspecified: Secondary | ICD-10-CM

## 2016-01-27 DIAGNOSIS — Z23 Encounter for immunization: Secondary | ICD-10-CM

## 2016-01-27 DIAGNOSIS — Z1211 Encounter for screening for malignant neoplasm of colon: Secondary | ICD-10-CM

## 2016-01-27 DIAGNOSIS — Z7189 Other specified counseling: Secondary | ICD-10-CM

## 2016-01-27 DIAGNOSIS — E2839 Other primary ovarian failure: Secondary | ICD-10-CM

## 2016-01-27 NOTE — Progress Notes (Signed)
Pre visit review using our clinic review tool, if applicable. No additional management support is needed unless otherwise documented below in the visit note.  I have personally reviewed the Medicare Annual Wellness questionnaire and have noted 1. The patient's medical and social history 2. Their use of alcohol, tobacco or illicit drugs 3. Their current medications and supplements 4. The patient's functional ability including ADL's, fall risks, home safety risks and hearing or visual             impairment. 5. Diet and physical activities 6. Evidence for depression or mood disorders  The patients weight, height, BMI have been recorded in the chart and visual acuity is per eye clinic.  I have made referrals, counseling and provided education to the patient based review of the above and I have provided the pt with a written personalized care plan for preventive services.  Provider list updated- see scanned forms.  Routine anticipatory guidance given to patient.  See health maintenance.  Flu 2016 Shingles 2004 PNA 2017 Tetanus 2014 D/w patient [id number]:options for colon cancer screening, including IFOB vs. colonoscopy.  Risks and benefits of both were discussed and patient voiced understanding.  Pt elects [id number]:IFOB.  Breast cancer screening- mammogram 2017 Advance directive- daughter Carollee HerterShannon designated if patient were incapacitated.   Cognitive function addressed- see scanned forms- and if abnormal then additional documentation follows.  Mild inc in sugar.  She is back on weight watchers and working on gradual weight loss. D/w pt.   DXA pending.   She has B ETD sx after a plane flight. D/w pt.  See plan.   PMH and SH reviewed  Meds, vitals, and allergies reviewed.   ROS: See HPI.  Otherwise negative.    GEN: nad, alert and oriented HEENT: mucous membranes moist, TM wnl B NECK: supple w/o LA CV: rrr. PULM: ctab, no inc wob ABD: soft, +bs EXT: no edema SKIN: no acute rash

## 2016-01-27 NOTE — Patient Instructions (Signed)
Go to the lab on the way out.  We'll contact you with your lab report. Try flonase and gently try to pop your ears.  Take care.  Glad to see you.

## 2016-01-28 LAB — HEPATITIS C ANTIBODY: HCV AB: NEGATIVE

## 2016-01-28 NOTE — Assessment & Plan Note (Signed)
Flu 2016 Shingles 2004 PNA 2017 Tetanus 2014 D/w patient [id number]:options for colon cancer screening, including IFOB vs. colonoscopy.  Risks and benefits of both were discussed and patient voiced understanding.  Pt elects [id number]:IFOB.  Breast cancer screening- mammogram 2017 Advance directive- daughter Carollee HerterShannon designated if patient were incapacitated.   Cognitive function addressed- see scanned forms- and if abnormal then additional documentation follows.  Mild inc in sugar.  She is back on weight watchers and working on gradual weight loss. D/w pt.   DXA pending.   She can try OTC flonase for likely ETD sx.  dw pt.  She agrees.

## 2016-03-02 ENCOUNTER — Ambulatory Visit
Admission: RE | Admit: 2016-03-02 | Discharge: 2016-03-02 | Disposition: A | Discharge status: Home / Self Care | Payer: Medicare Other | Source: Ambulatory Visit | Attending: Family Medicine | Admitting: Family Medicine

## 2016-03-02 ENCOUNTER — Telehealth: Payer: Self-pay | Admitting: Family Medicine

## 2016-03-02 DIAGNOSIS — E2839 Other primary ovarian failure: Secondary | ICD-10-CM

## 2016-03-02 DIAGNOSIS — M85852 Other specified disorders of bone density and structure, left thigh: Secondary | ICD-10-CM | POA: Diagnosis not present

## 2016-03-02 DIAGNOSIS — Z780 Asymptomatic menopausal state: Secondary | ICD-10-CM | POA: Diagnosis not present

## 2016-03-02 NOTE — Telephone Encounter (Signed)
[facility] called and wanted clarification for DEXA scan.  [facility], but patient prefers to begin using [facility].  Checked with PCP, he stated he did not need comparison study.  Ok to complete scan.

## 2016-03-09 ENCOUNTER — Encounter: Payer: Self-pay | Admitting: *Deleted

## 2016-03-16 ENCOUNTER — Other Ambulatory Visit (INDEPENDENT_AMBULATORY_CARE_PROVIDER_SITE_OTHER): Payer: Medicare Other

## 2016-03-16 DIAGNOSIS — Z1211 Encounter for screening for malignant neoplasm of colon: Secondary | ICD-10-CM

## 2016-03-17 LAB — FECAL OCCULT BLOOD, IMMUNOCHEMICAL: Fecal Occult Bld: NEGATIVE

## 2016-08-02 DIAGNOSIS — Z23 Encounter for immunization: Secondary | ICD-10-CM | POA: Diagnosis not present

## 2017-05-27 ENCOUNTER — Telehealth: Payer: Self-pay | Admitting: Family Medicine

## 2017-05-27 DIAGNOSIS — M8580 Other specified disorders of bone density and structure, unspecified site: Secondary | ICD-10-CM

## 2017-05-27 DIAGNOSIS — E785 Hyperlipidemia, unspecified: Secondary | ICD-10-CM

## 2017-05-27 DIAGNOSIS — M899 Disorder of bone, unspecified: Secondary | ICD-10-CM

## 2017-05-27 NOTE — Telephone Encounter (Signed)
Please enter AWV labs. [facility] due to closer to home. Please advise. Thanks

## 2017-05-29 NOTE — Telephone Encounter (Signed)
Ordered. Thanks

## 2017-05-30 NOTE — Telephone Encounter (Signed)
Patient notified by telephone that labs have been ordered per Dr. Para Marchuncan.

## 2017-06-22 ENCOUNTER — Other Ambulatory Visit (INDEPENDENT_AMBULATORY_CARE_PROVIDER_SITE_OTHER): Payer: Medicare Other

## 2017-06-22 DIAGNOSIS — M899 Disorder of bone, unspecified: Secondary | ICD-10-CM | POA: Diagnosis not present

## 2017-06-22 DIAGNOSIS — E785 Hyperlipidemia, unspecified: Secondary | ICD-10-CM

## 2017-06-22 LAB — LIPID PANEL
Cholesterol: 214 mg/dL — ABNORMAL HIGH (ref 0–200)
HDL: 49.7 mg/dL (ref >39.00)
LDL Cholesterol: 143 mg/dL — ABNORMAL HIGH (ref 0–99)
NONHDL: 164.62
TRIGLYCERIDES: 109 mg/dL (ref 0.0–149.0)
Total CHOL/HDL Ratio: 4
VLDL: 21.8 mg/dL (ref 0.0–40.0)

## 2017-06-22 LAB — VITAMIN D 25 HYDROXY (VIT D DEFICIENCY, FRACTURES): VITD: 33.55 ng/mL (ref 30.00–100.00)

## 2017-06-22 LAB — COMPREHENSIVE METABOLIC PANEL
ALT: 10 U/L (ref 0–35)
AST: 16 U/L (ref 0–37)
Albumin: 4 g/dL (ref 3.5–5.2)
Alkaline Phosphatase: 48 U/L (ref 39–117)
BILIRUBIN TOTAL: 0.6 mg/dL (ref 0.2–1.2)
BUN: 17 mg/dL (ref 6–23)
CALCIUM: 8.9 mg/dL (ref 8.4–10.5)
CHLORIDE: 105 meq/L (ref 96–112)
CO2: 28 meq/L (ref 19–32)
Creatinine, Ser: 0.82 mg/dL (ref 0.40–1.20)
GFR: 73.58 mL/min (ref >60.00)
Glucose, Bld: 104 mg/dL — ABNORMAL HIGH (ref 70–99)
POTASSIUM: 4.2 meq/L (ref 3.5–5.1)
Sodium: 139 mEq/L (ref 135–145)
Total Protein: 6.8 g/dL (ref 6.0–8.3)

## 2017-06-28 ENCOUNTER — Ambulatory Visit: Payer: Medicare Other

## 2017-06-30 ENCOUNTER — Ambulatory Visit (INDEPENDENT_AMBULATORY_CARE_PROVIDER_SITE_OTHER): Payer: Medicare Other | Admitting: Family Medicine

## 2017-06-30 ENCOUNTER — Ambulatory Visit (INDEPENDENT_AMBULATORY_CARE_PROVIDER_SITE_OTHER): Payer: Medicare Other

## 2017-06-30 ENCOUNTER — Encounter: Payer: Self-pay | Admitting: Family Medicine

## 2017-06-30 VITALS — BP 110/76 | HR 78 | Temp 98.6°F | Ht 67.25 in | Wt 211.0 lb

## 2017-06-30 DIAGNOSIS — Z1211 Encounter for screening for malignant neoplasm of colon: Secondary | ICD-10-CM

## 2017-06-30 DIAGNOSIS — Z7189 Other specified counseling: Secondary | ICD-10-CM | POA: Diagnosis not present

## 2017-06-30 DIAGNOSIS — M7062 Trochanteric bursitis, left hip: Secondary | ICD-10-CM

## 2017-06-30 DIAGNOSIS — M7061 Trochanteric bursitis, right hip: Secondary | ICD-10-CM

## 2017-06-30 DIAGNOSIS — Z8639 Personal history of other endocrine, nutritional and metabolic disease: Secondary | ICD-10-CM | POA: Diagnosis not present

## 2017-06-30 DIAGNOSIS — Z7184 Encounter for health counseling related to travel: Secondary | ICD-10-CM

## 2017-06-30 DIAGNOSIS — Z0000 Encounter for general adult medical examination without abnormal findings: Secondary | ICD-10-CM

## 2017-06-30 MED ORDER — ATOVAQUONE-PROGUANIL HCL 250-100 MG PO TABS
1.0000 | ORAL_TABLET | Freq: Every day | ORAL | 0 refills | Status: DC
Start: 2017-06-30 — End: 2017-08-23

## 2017-06-30 MED ORDER — TYPHOID VACCINE PO CPDR: 1.0000 | DELAYED_RELEASE_CAPSULE | ORAL | 0 refills | Status: DC

## 2017-06-30 NOTE — Patient Instructions (Signed)
Ms. Glendell DockerCooke , Thank you for taking time to come for your Medicare Wellness Visit. I appreciate your ongoing commitment to your health goals. Please review the following plan we discussed and let me know if I can assist you in the future.   These are the goals we discussed: Goals    . Weight (lb) < 176 lb (79.8 kg)          Starting 06/30/2017, I will continue to participate in Weight Watchers online in an effort to lose weight. Also, I plan to join [employer] in an effort to minimize joint pain in October 2018.        This is a list of the screening recommended for you and due dates:  Health Maintenance  Topic Date Due  . Colon Cancer Screening  10/24/2017*  . Flu Shot  01/22/2018*  . Mammogram  11/17/2017  . Tetanus Vaccine  10/25/2022  . DEXA scan (bone density measurement)  Completed  .  Hepatitis C: One time screening is recommended by Center for Disease Control  (CDC) for  adults born from [birth year] through 1965.   Completed  . Pneumonia vaccines  Completed  *Topic was postponed. The date shown is not the original due date.   Preventive Care for Adults  A healthy lifestyle and preventive care can promote health and wellness. Preventive health guidelines for adults include the following key practices.  . A routine yearly physical is a good way to check with your health care provider about your health and preventive screening. It is a chance to share any concerns and updates on your health and to receive a thorough exam.  . Visit your dentist for a routine exam and preventive care every 6 months. Brush your teeth twice a day and floss once a day. Good oral hygiene prevents tooth decay and gum disease.  . The frequency of eye exams is based on your age, health, family medical history, use  of contact lenses, and other factors. Follow your health care provider's ecommendations for frequency of eye exams.  . Eat a healthy diet. Foods like vegetables, fruits, whole grains, low-fat  dairy products, and lean protein foods contain the nutrients you need without too many calories. Decrease your intake of foods high in solid fats, added sugars, and salt. Eat the right amount of calories for you. Get information about a proper diet from your health care provider, if necessary.  . Regular physical exercise is one of the most important things you can do for your health. Most adults should get at least 150 minutes of moderate-intensity exercise (any activity that increases your heart rate and causes you to sweat) each week. In addition, most adults need muscle-strengthening exercises on 2 or more days a week.  Silver Sneakers may be a benefit available to you. To determine eligibility, you may visit the website: www.silversneakers.com or contact program at [phone] Mon-Fri between 8AM-8PM.   . Maintain a healthy weight. The body mass index (BMI) is a screening tool to identify possible weight problems. It provides an estimate of body fat based on height and weight. Your health care provider can find your BMI and can help you achieve or maintain a healthy weight.   For adults 20 years and older: ? A BMI below 18.5 is considered underweight. ? A BMI of 18.5 to 24.9 is normal. ? A BMI of 25 to 29.9 is considered overweight. ? A BMI of 30 and above is considered obese.   .Marland Kitchen  Maintain normal blood lipids and cholesterol levels by exercising and minimizing your intake of saturated fat. Eat a balanced diet with plenty of fruit and vegetables. Blood tests for lipids and cholesterol should begin at age 76 and be repeated every 5 years. If your lipid or cholesterol levels are high, you are over 50, or you are at high risk for heart disease, you may need your cholesterol levels checked more frequently. Ongoing high lipid and cholesterol levels should be treated with medicines if diet and exercise are not working.  . If you smoke, find out from your health care provider how to quit. If you do  not use tobacco, please do not start.  . If you choose to drink alcohol, please do not consume more than 2 drinks per day. One drink is considered to be 12 ounces (355 mL) of beer, 5 ounces (148 mL) of wine, or 1.5 ounces (44 mL) of liquor.  . If you are 73-46 years old, ask your health care provider if you should take aspirin to prevent strokes.  . Use sunscreen. Apply sunscreen liberally and repeatedly throughout the day. You should seek shade when your shadow is shorter than you. Protect yourself by wearing long sleeves, pants, a wide-brimmed hat, and sunglasses year round, whenever you are outdoors.  . Once a month, do a whole body skin exam, using a mirror to look at the skin on your back. Tell your health care provider of new moles, moles that have irregular borders, moles that are larger than a pencil eraser, or moles that have changed in shape or color.

## 2017-06-30 NOTE — Progress Notes (Signed)
Subjective:   Theresa Eaton is a 66 y.o. female who presents for Medicare Annual (Subsequent) preventive examination.  Review of Systems:  N/A Cardiac Risk Factors include: advanced age (>1255men, 34>65 women);dyslipidemia;hypertension;obesity (BMI >30kg/m2)     Objective:     Vitals: BP 110/76 (BP Location: Right Arm, Patient Position: Sitting, Cuff Size: Normal)   Pulse 78   Temp 98.6 F (37 C) (Oral)   Ht 5' 7.25" (1.708 m) Comment: no shoes  Wt 211 lb (95.7 kg)   SpO2 97%   BMI 32.80 kg/m   Body mass index is 32.8 kg/m.   Tobacco History  Smoking Status  . Never Smoker  Smokeless Tobacco  . Never Used     Counseling given: No   Past Medical History:  Diagnosis Date  . Chicken pox   . Diabetes mellitus without complication (HCC)    history of  . GERD (gastroesophageal reflux disease)   . Hyperlipidemia   . Hypertension   . Osteopenia    started on fosamax 2013   Past Surgical History:  Procedure Laterality Date  . Laser closure of greater saphenous vein, Left  2015  . TUBAL LIGATION     Family History  Problem Relation Age of Onset  . Hyperlipidemia Mother   . Dementia Mother   . Cancer Father        prostate  . Osteoporosis Father   . Cancer Sister        breast  . Diabetes Sister   . Breast cancer Sister   . Osteoporosis Brother   . Osteoporosis Maternal Aunt   . Diabetes Maternal Grandmother   . Heart disease Maternal Grandfather   . Heart disease Paternal Grandfather   . Colon cancer Neg Hx    History  Sexual Activity  . Sexual activity: Not on file    Outpatient Encounter Prescriptions as of 06/30/2017  Medication Sig  . IBUPROFEN PO Take by mouth as needed.  . Multiple Vitamins-Minerals (CENTRUM SILVER ADULT 50+ PO) Take 1 tablet by mouth daily.  . [DISCONTINUED] aspirin 81 MG tablet Take 81 mg by mouth every other day.  . [DISCONTINUED] CALCIUM CITRATE PO Take 300 mg by mouth daily.  . [DISCONTINUED] cholecalciferol (VITAMIN D)  1000 units tablet Take 1,000 Units by mouth daily.   No facility-administered encounter medications on file as of 06/30/2017.     Activities of Daily Living In your present state of health, do you have any difficulty performing the following activities: 06/30/2017  Hearing? N  Vision? N  Difficulty concentrating or making decisions? N  Walking or climbing stairs? Y  Comment bilateral hip pain  Dressing or bathing? N  Doing errands, shopping? N  Preparing Food and eating ? N  Using the Toilet? N  In the past six months, have you accidently leaked urine? Y  Do you have problems with loss of bowel control? N  Managing your Medications? N  Managing your Finances? N  Housekeeping or managing your Housekeeping? N  Some recent data might be hidden    Patient Care Team: Joaquim Namuncan, Graham S, MD as PCP - General (Family Medicine)    Assessment:     Hearing Screening   125Hz  250Hz  500Hz  1000Hz  2000Hz  3000Hz  4000Hz  6000Hz  8000Hz   Right ear:   40 40 40  40    Left ear:   40 40 40  40    Vision Screening Comments: Last vision exam in Feb 2018 @ [facility]  Exercise Activities and Dietary recommendations Current Exercise Habits: Home exercise routine, Type of exercise: strength training/weights;Other - see comments;walking (cardio), Time (Minutes): 45, Frequency (Times/Week): 2, Weekly Exercise (Minutes/Week): 90, Intensity: Moderate, Exercise limited by: None identified  Goals    . Weight (lb) < 176 lb (79.8 kg)          Starting 06/30/2017, I will continue to participate in Weight Watchers online in an effort to lose weight. Also, I plan to join [employer] in an effort to minimize joint pain in October 2018.       Fall Risk Fall Risk  06/30/2017 01/27/2016 09/16/2014  Falls in the past year? No No No   Depression Screen PHQ 2/9 Scores 06/30/2017 01/27/2016 09/16/2014 03/09/2013  PHQ - 2 Score 0 0 0 0  PHQ- 9 Score 0 - - -     Cognitive Function MMSE - Mini Mental State Exam  06/30/2017  Orientation to time 5  Orientation to Place 5  Registration 3  Attention/ Calculation 0  Recall 3  Language- name 2 objects 0  Language- repeat 1  Language- follow 3 step command 3  Language- read & follow direction 0  Write a sentence 0  Copy design 0  Total score 20       PLEASE NOTE: A Mini-Cog screen was completed. Maximum score is 20. A value of 0 denotes this part of Folstein MMSE was not completed or the patient failed this part of the Mini-Cog screening.   Mini-Cog Screening Orientation to Time - Max 5 pts Orientation to Place - Max 5 pts Registration - Max 3 pts Recall - Max 3 pts Language Repeat - Max 1 pts Language Follow 3 Step Command - Max 3 pts   Immunization History  Administered Date(s) Administered  . Influenza,inj,Quad PF,6+ Mos 08/23/2013, 09/16/2014  . Influenza-Unspecified 07/25/2016  . Pneumococcal Conjugate-13 09/16/2014  . Pneumococcal Polysaccharide-23 01/27/2016  . Td 10/25/2012  . Zoster 08/27/2003   Screening Tests Health Maintenance  Topic Date Due  . COLONOSCOPY  10/24/2017 (Originally 10/26/2015)  . INFLUENZA VACCINE  01/22/2018 (Originally 05/25/2017)  . MAMMOGRAM  11/17/2017  . TETANUS/TDAP  10/25/2022  . DEXA SCAN  Completed  . Hepatitis C Screening  Completed  . PNA vac Low Risk Adult  Completed      Plan:     I have personally reviewed and addressed the Medicare Annual Wellness questionnaire and have noted the following in the patient's chart:  A. Medical and social history B. Use of alcohol, tobacco or illicit drugs  C. Current medications and supplements D. Functional ability and status E.  Nutritional status F.  Physical activity G. Advance directives H. List of other physicians I.  Hospitalizations, surgeries, and ER visits in previous 12 months J.  Vitals K. Screenings to include hearing, vision, cognitive, depression L. Referrals and appointments - none  In addition, I have reviewed and discussed with  patient certain preventive protocols, quality metrics, and best practice recommendations. A written personalized care plan for preventive services as well as general preventive health recommendations were provided to patient.  See attached scanned questionnaire for additional information.   Signed,   Randa Evens, MHA, BS, LPN Health Coach

## 2017-06-30 NOTE — Progress Notes (Signed)
Pre visit review using our clinic review tool, if applicable. No additional management support is needed unless otherwise documented below in the visit note. 

## 2017-06-30 NOTE — Progress Notes (Signed)
PCP notes:   Health maintenance:  Colon cancer screening - PCP please address at next appt Flu vaccine - addressed  Abnormal screenings:   None  Patient concerns:   Anti-malarial precautions/medication - patient states she is getting ready to travel; PCP please address at next appt  Bilateral hip pain - worsens between 6PM and 6AM; affecting sleep.   Nurse concerns:  None  Next PCP appt:   06/30/17 @ 1500  I reviewed health advisor's note, was available for consultation on the day of service listed in this note, and agree with documentation and plan. Crawford GivensGraham Duncan, MD.

## 2017-06-30 NOTE — Progress Notes (Signed)
Travelling to [place] in the near future.  Malaria prophylaxis discussed with patient. Malarone is reasonable.  Rationale d/w pt.  Typhoid vaccine d/w pt.   prescription sent. Routine cautions discussed with patient. She is traveling with a church group. She has traveled overseas previously.  CDC site reviewed with patient.  D/w patient [id number]:options for colon cancer screening, including IFOB vs. colonoscopy.  Risks and benefits of both were discussed and patient voiced understanding.  Pt elects for: colonoscopy.   Hip pain, worse at night.  Noted in the last few months.  Sharp pain.  Taking ibuprofen.  No trauma.  Advance directive- daughter Majel Homer(Shannon Baker) designated if patient were incapacitated.   History of diabetes. Sugar improved with changes in diet and exercise. Labs discussed with patient.  Meds, vitals, and allergies reviewed.   ROS: Per HPI unless specifically indicated in ROS section   GEN: nad, alert and oriented HEENT: mucous membranes moist NECK: supple w/o LA CV: rrr. PULM: ctab, no inc wob ABD: soft, +bs EXT: no edema SKIN: no acute rash Normal ROM at the hips B but B greater trochanteric bursa area tender palpation. Able to bear weight.

## 2017-06-30 NOTE — Patient Instructions (Addendum)
See if you have have the HAV/HBV vaccines previously.   I sent the malarone and typhoid to the pharmacy.  Ice your hips- 5 minutes on each side.  Stop ibuprofen, try aleve, 1-2 pills with food at supper.  Update me next week if not any better.   Take care.  Glad to see you.

## 2017-07-01 ENCOUNTER — Encounter: Payer: Self-pay | Admitting: Internal Medicine

## 2017-07-01 DIAGNOSIS — Z1211 Encounter for screening for malignant neoplasm of colon: Secondary | ICD-10-CM | POA: Insufficient documentation

## 2017-07-01 DIAGNOSIS — M7060 Trochanteric bursitis, unspecified hip: Secondary | ICD-10-CM | POA: Insufficient documentation

## 2017-07-01 DIAGNOSIS — Z7184 Encounter for health counseling related to travel: Secondary | ICD-10-CM | POA: Insufficient documentation

## 2017-07-01 NOTE — Assessment & Plan Note (Signed)
Likely. More pain sleeping on her side at night. Tender in the expected area today on exam. She can try taking Aleve after supper and see if that last longer than ibuprofen. Ice her hips. Update me as needed. She agrees.

## 2017-07-01 NOTE — Assessment & Plan Note (Signed)
Advance directive- daughter Majel Homer(Shannon Baker) designated if patient were incapacitated.

## 2017-07-01 NOTE — Assessment & Plan Note (Signed)
History of diabetes. Sugar improved with changes in diet and exercise. Labs discussed with patient.

## 2017-07-01 NOTE — Assessment & Plan Note (Addendum)
D/w patient [id number]:options for colon cancer screening, including IFOB vs. colonoscopy.  Risks and benefits of both were discussed and patient voiced understanding.  Pt elects for: colonoscopy.  Refer.

## 2017-07-01 NOTE — Assessment & Plan Note (Signed)
Travelling to [place] in the near future.  Malaria prophylaxis discussed with patient. Malarone is reasonable.  Rationale d/w pt.  Typhoid vaccine d/w pt.   prescription sent. Routine cautions discussed with patient. She is traveling with a church group. She has traveled overseas previously. >25 minutes spent in face to face time with patient, >50% spent in counselling or coordination of care.

## 2017-08-23 ENCOUNTER — Ambulatory Visit (AMBULATORY_SURGERY_CENTER): Payer: Self-pay | Admitting: *Deleted

## 2017-08-23 VITALS — Ht 67.5 in | Wt 212.2 lb

## 2017-08-23 DIAGNOSIS — Z1211 Encounter for screening for malignant neoplasm of colon: Secondary | ICD-10-CM

## 2017-08-23 DIAGNOSIS — Z23 Encounter for immunization: Secondary | ICD-10-CM | POA: Diagnosis not present

## 2017-08-23 MED ORDER — NA SULFATE-K SULFATE-MG SULF 17.5-3.13-1.6 GM/177ML PO SOLN: 1.0000 [IU] | Freq: Once | ORAL | 0 refills | Status: AC

## 2017-08-23 NOTE — Progress Notes (Signed)
No egg or soy allergy known to patient  No issues with past sedation with any surgeries  or procedures, no intubation problems  No diet pills per patient No home 02 use per patient  No blood thinners per patient  Pt denies issues with constipation  No A fib or A flutter  EMMI video sent to pt's e mail  

## 2017-09-06 ENCOUNTER — Encounter: Payer: Self-pay | Admitting: Internal Medicine

## 2017-09-06 ENCOUNTER — Ambulatory Visit (AMBULATORY_SURGERY_CENTER): Payer: Medicare Other | Admitting: Internal Medicine

## 2017-09-06 VITALS — BP 95/65 | HR 71 | Temp 96.0°F | Resp 13 | Ht 67.5 in | Wt 212.0 lb

## 2017-09-06 DIAGNOSIS — Z1211 Encounter for screening for malignant neoplasm of colon: Secondary | ICD-10-CM | POA: Diagnosis not present

## 2017-09-06 DIAGNOSIS — Z1212 Encounter for screening for malignant neoplasm of rectum: Secondary | ICD-10-CM | POA: Diagnosis not present

## 2017-09-06 DIAGNOSIS — E119 Type 2 diabetes mellitus without complications: Secondary | ICD-10-CM | POA: Diagnosis not present

## 2017-09-06 DIAGNOSIS — K219 Gastro-esophageal reflux disease without esophagitis: Secondary | ICD-10-CM | POA: Diagnosis not present

## 2017-09-06 MED ORDER — SODIUM CHLORIDE 0.9 % IV SOLN: 500.0000 mL | INTRAVENOUS | Status: DC

## 2017-09-06 NOTE — Patient Instructions (Signed)
   INFORMATION ON HEMORRHOIDS GIVEN TO YOU TODAY  YOU HAD AN ENDOSCOPIC PROCEDURE TODAY AT THE  ENDOSCOPY CENTER:   Refer to the procedure report that was given to you for any specific questions about what was found during the examination.  If the procedure report does not answer your questions, please call your gastroenterologist to clarify.  If you requested that your care partner not be given the details of your procedure findings, then the procedure report has been included in a sealed envelope for you to review at your convenience later.  YOU SHOULD EXPECT: Some feelings of bloating in the abdomen. Passage of more gas than usual.  Walking can help get rid of the air that was put into your GI tract during the procedure and reduce the bloating. If you had a lower endoscopy (such as a colonoscopy or flexible sigmoidoscopy) you may notice spotting of blood in your stool or on the toilet paper. If you underwent a bowel prep for your procedure, you may not have a normal bowel movement for a few days.  Please Note:  You might notice some irritation and congestion in your nose or some drainage.  This is from the oxygen used during your procedure.  There is no need for concern and it should clear up in a day or so.  SYMPTOMS TO REPORT IMMEDIATELY:   Following lower endoscopy (colonoscopy or flexible sigmoidoscopy):  Excessive amounts of blood in the stool  Significant tenderness or worsening of abdominal pains  Swelling of the abdomen that is new, acute  Fever of 100F or higher    For urgent or emergent issues, a gastroenterologist can be reached at any hour by calling [phone].   DIET:  We do recommend a small meal at first, but then you may proceed to your regular diet.  Drink plenty of fluids but you should avoid alcoholic beverages for 24 hours.  ACTIVITY:  You should plan to take it easy for the rest of today and you should NOT DRIVE or use heavy machinery until tomorrow  (because of the sedation medicines used during the test).    FOLLOW UP: Our staff will call the number listed on your records the next business day following your procedure to check on you and address any questions or concerns that you may have regarding the information given to you following your procedure. If we do not reach you, we will leave a message.  However, if you are feeling well and you are not experiencing any problems, there is no need to return our call.  We will assume that you have returned to your regular daily activities without incident.  If any biopsies were taken you will be contacted by phone or by letter within the next 1-3 weeks.  Please call us at [phone] if you have not heard about the biopsies in 3 weeks.    SIGNATURES/CONFIDENTIALITY: You and/or your care partner have signed paperwork which will be entered into your electronic medical record.  These signatures attest to the fact that that the information above on your After Visit Summary has been reviewed and is understood.  Full responsibility of the confidentiality of this discharge information lies with you and/or your care-partner. 

## 2017-09-06 NOTE — Progress Notes (Signed)
Report given to PACU, vss 

## 2017-09-06 NOTE — Op Note (Signed)
[facility] Patient Name: Theresa Eaton Procedure Date: 09/06/2017 8:28 AM MRN: [id number] Endoscopist: Theresa Eaton , MD Age: 66 Referring MD:  Date of Birth: Sep 23, 1951 Gender: Female Account #: [id number] Procedure:                Colonoscopy Indications:              Screening for colorectal malignant neoplasm, Last                            colonoscopy 10 years ago Medicines:                Monitored Anesthesia Care Procedure:                Pre-Anesthesia Assessment:                           - Prior to the procedure, a History and Physical                            was performed, and patient medications and                            allergies were reviewed. The patient's tolerance of                            previous anesthesia was also reviewed. The risks                            and benefits of the procedure and the sedation                            options and risks were discussed with the patient.                            All questions were answered, and informed consent                            was obtained. Prior Anticoagulants: The patient has                            taken no previous anticoagulant or antiplatelet                            agents. ASA Grade Assessment: I - A normal, healthy                            patient. After reviewing the risks and benefits,                            the patient was deemed in satisfactory condition to                            undergo the procedure.  After obtaining informed consent, the colonoscope                            was passed under direct vision. Throughout the                            procedure, the patient's blood pressure, pulse, and                            oxygen saturations were monitored continuously. The                            Colonoscope was introduced through the anus and                            advanced to the the cecum, identified by                     appendiceal orifice and ileocecal valve. The                            colonoscopy was performed without difficulty. The                            patient tolerated the procedure well. The quality                            of the bowel preparation was good. The ileocecal                            valve, appendiceal orifice, and rectum were                            photographed. Scope In: 8:38:22 AM Scope Out: 8:59:59 AM Scope Withdrawal Time: 0 hours 10 minutes 58 seconds  Total Procedure Duration: 0 hours 21 minutes 37 seconds  Findings:                 The digital rectal exam was normal.                           Internal hemorrhoids and hypertropied anal papillae                            were found during retroflexion. The hemorrhoids                            were small.                           The exam was otherwise without abnormality. Complications:            No immediate complications. Estimated Blood Loss:     Estimated blood loss: none. Impression:               - Small internal hemorrhoids.                           -  The examination was otherwise normal.                           - No specimens collected. Recommendation:           - Patient has a contact number available for                            emergencies. The signs and symptoms of potential                            delayed complications were discussed with the                            patient. Return to normal activities tomorrow.                            Written discharge instructions were provided to the                            patient.                           - Resume previous diet.                           - Continue present medications.                           - Repeat colonoscopy in 10 years for screening                            purposes. Theresa FiedlerJay M Bryahna Lesko, MD 09/06/2017 9:04:17 AM This report has been signed electronically.

## 2017-09-07 ENCOUNTER — Telehealth: Payer: Self-pay

## 2017-09-07 NOTE — Telephone Encounter (Signed)
  Follow up Call-  Call back number 09/06/2017  Post procedure Call Back phone  # [phone]  Permission to leave phone message Yes  Some recent data might be hidden     Patient questions:  Do you have a fever, pain , or abdominal swelling? No. Pain Score  0 *  Have you tolerated food without any problems? Yes.    Have you been able to return to your normal activities? Yes.    Do you have any questions about your discharge instructions: Diet   No. Medications  No. Follow up visit  No.  Do you have questions or concerns about your Care? No.  Actions: * If pain score is 4 or above: No action needed, pain <4.

## 2018-06-28 ENCOUNTER — Encounter: Payer: Self-pay | Admitting: Family Medicine

## 2018-07-03 DIAGNOSIS — Z23 Encounter for immunization: Secondary | ICD-10-CM | POA: Diagnosis not present

## 2018-07-04 ENCOUNTER — Telehealth: Payer: Self-pay

## 2018-07-04 DIAGNOSIS — E785 Hyperlipidemia, unspecified: Secondary | ICD-10-CM

## 2018-07-04 DIAGNOSIS — M899 Disorder of bone, unspecified: Secondary | ICD-10-CM

## 2018-07-04 NOTE — Telephone Encounter (Signed)
Copied from CRM [id number]. Topic: Appointment Scheduling - Scheduling Inquiry for Clinic >> Jul 04, 2018 11:52 AM Arlyss Gandy, NT wrote: Reason for CRM: Patient cancelled her AWV and physical appt with Dr. Para March in October. She would like to see if she can have her labs drawn at the [facility] office, then have her AWV and Physical with Dr. Para March on the same day after 11/9 due to being out of town until then. Please call to advise pt.  Please review above. Ok to do that?-Salil Raineri [employer], RMA

## 2018-07-06 NOTE — Addendum Note (Signed)
Addended by: Joaquim NamUNCAN, Paidyn Mcferran S on: 07/06/2018 11:57 PM   Modules accepted: Orders

## 2018-07-06 NOTE — Telephone Encounter (Signed)
Yes, I put in the lab orders.  [facility]  Thanks.

## 2018-07-07 NOTE — Telephone Encounter (Signed)
Spoke with patient and advised as below and appointments scheduled-Naylin Burkle V Dennies Coate, RMA

## 2018-07-10 ENCOUNTER — Ambulatory Visit (INDEPENDENT_AMBULATORY_CARE_PROVIDER_SITE_OTHER): Payer: Medicare Other | Admitting: Family Medicine

## 2018-07-10 ENCOUNTER — Encounter: Payer: Self-pay | Admitting: Family Medicine

## 2018-07-10 VITALS — BP 120/68 | HR 62 | Temp 98.7°F | Ht 67.5 in | Wt 208.0 lb

## 2018-07-10 DIAGNOSIS — Z7184 Encounter for health counseling related to travel: Secondary | ICD-10-CM

## 2018-07-10 DIAGNOSIS — Z23 Encounter for immunization: Secondary | ICD-10-CM | POA: Diagnosis not present

## 2018-07-10 DIAGNOSIS — Z7189 Other specified counseling: Secondary | ICD-10-CM

## 2018-07-10 MED ORDER — AZITHROMYCIN 500 MG PO TABS: ORAL_TABLET | ORAL | 0 refills | Status: DC

## 2018-07-10 MED ORDER — ACETAZOLAMIDE 125 MG PO TABS: 125.0000 mg | ORAL_TABLET | Freq: Two times a day (BID) | ORAL | 0 refills | Status: DC

## 2018-07-10 NOTE — Patient Instructions (Addendum)
Take pepto bismol 1-2 tabs a day to prevent diarrhea.   Zithromax if needed for diarrhea, if it happens.  Second dose of HAV/HBV in at least 4 weeks prior to trip.  3rd dose in 6 months.  Follow up vaccines can be done at nurse visits.  Take care.  Glad to see you.  Have a great trip.

## 2018-07-10 NOTE — Progress Notes (Signed)
Discussed CDC cautions and routine traveling precautions. She is travelling to [place].  She will be there about 2-1/2 weeks, hiking.  We talked about altitude considerations.  See plan.  She is feeling well otherwise.  Meds, vitals, and allergies reviewed.   ROS: Per HPI unless specifically indicated in ROS section   GEN: nad, alert and oriented HEENT: mucous membranes moist NECK: supple w/o LA CV: rrr.  PULM: ctab, no inc wob ABD: soft, +bs EXT: no edema SKIN: Well-perfused  See avs.

## 2018-07-12 NOTE — Assessment & Plan Note (Signed)
Unlikely that she would need rabies vaccination.  Typhoid vaccination previously done.  She previously had measles and has a documented case from childhood so she would not need revaccination.  Would not need prophylaxis for malaria and Japanese encephalitis, discussed with patient.  Reasonable to start hepatitis A and hepatitis B vaccine.  She can use Diamox if needed for altitude sickness.  We talked about that starting that prior to her trip and taking it during the time in country.  We talked about traveler's diarrhea prophylaxis with Pepto-Bismol wafers.  She can use Zithromax if symptomatic.  Routine cautions given.  I expect her to have a good trip.  All questions answered to the best my ability.  Flu shot and tetanus up-to-date.

## 2018-08-08 ENCOUNTER — Ambulatory Visit: Payer: Medicare Other

## 2018-08-10 ENCOUNTER — Ambulatory Visit: Payer: Medicare Other

## 2018-08-15 ENCOUNTER — Ambulatory Visit (INDEPENDENT_AMBULATORY_CARE_PROVIDER_SITE_OTHER): Payer: Medicare Other | Admitting: *Deleted

## 2018-08-15 ENCOUNTER — Other Ambulatory Visit (INDEPENDENT_AMBULATORY_CARE_PROVIDER_SITE_OTHER): Payer: Medicare Other

## 2018-08-15 DIAGNOSIS — E785 Hyperlipidemia, unspecified: Secondary | ICD-10-CM | POA: Diagnosis not present

## 2018-08-15 DIAGNOSIS — Z23 Encounter for immunization: Secondary | ICD-10-CM | POA: Diagnosis not present

## 2018-08-15 DIAGNOSIS — M899 Disorder of bone, unspecified: Secondary | ICD-10-CM

## 2018-08-15 LAB — COMPREHENSIVE METABOLIC PANEL
ALK PHOS: 58 U/L (ref 39–117)
ALT: 13 U/L (ref 0–35)
AST: 15 U/L (ref 0–37)
Albumin: 4.2 g/dL (ref 3.5–5.2)
BUN: 14 mg/dL (ref 6–23)
CHLORIDE: 104 meq/L (ref 96–112)
CO2: 32 mEq/L (ref 19–32)
Calcium: 9.4 mg/dL (ref 8.4–10.5)
Creatinine, Ser: 0.79 mg/dL (ref 0.40–1.20)
GFR: 76.56 mL/min (ref >60.00)
Glucose, Bld: 123 mg/dL — ABNORMAL HIGH (ref 70–99)
POTASSIUM: 4.5 meq/L (ref 3.5–5.1)
SODIUM: 141 meq/L (ref 135–145)
TOTAL PROTEIN: 7 g/dL (ref 6.0–8.3)
Total Bilirubin: 0.5 mg/dL (ref 0.2–1.2)

## 2018-08-15 LAB — LIPID PANEL
CHOLESTEROL: 220 mg/dL — AB (ref 0–200)
HDL: 60.9 mg/dL (ref >39.00)
LDL Cholesterol: 138 mg/dL — ABNORMAL HIGH (ref 0–99)
NONHDL: 159.25
Total CHOL/HDL Ratio: 4
Triglycerides: 105 mg/dL (ref 0.0–149.0)
VLDL: 21 mg/dL (ref 0.0–40.0)

## 2018-08-15 LAB — VITAMIN D 25 HYDROXY (VIT D DEFICIENCY, FRACTURES): VITD: 38.87 ng/mL (ref 30.00–100.00)

## 2018-08-16 ENCOUNTER — Encounter: Payer: Medicare Other | Admitting: Family Medicine

## 2018-09-04 ENCOUNTER — Ambulatory Visit: Payer: Medicare Other

## 2018-09-04 ENCOUNTER — Encounter: Payer: Self-pay | Admitting: Family Medicine

## 2018-09-04 ENCOUNTER — Ambulatory Visit (INDEPENDENT_AMBULATORY_CARE_PROVIDER_SITE_OTHER): Payer: Medicare Other | Admitting: Family Medicine

## 2018-09-04 VITALS — BP 118/82 | HR 62 | Temp 98.3°F | Ht 67.0 in | Wt 205.5 lb

## 2018-09-04 DIAGNOSIS — Z0000 Encounter for general adult medical examination without abnormal findings: Secondary | ICD-10-CM | POA: Diagnosis not present

## 2018-09-04 DIAGNOSIS — Z7189 Other specified counseling: Secondary | ICD-10-CM

## 2018-09-04 DIAGNOSIS — R739 Hyperglycemia, unspecified: Secondary | ICD-10-CM

## 2018-09-04 DIAGNOSIS — M8580 Other specified disorders of bone density and structure, unspecified site: Secondary | ICD-10-CM

## 2018-09-04 DIAGNOSIS — J029 Acute pharyngitis, unspecified: Secondary | ICD-10-CM

## 2018-09-04 DIAGNOSIS — E785 Hyperlipidemia, unspecified: Secondary | ICD-10-CM

## 2018-09-04 DIAGNOSIS — Z8639 Personal history of other endocrine, nutritional and metabolic disease: Secondary | ICD-10-CM | POA: Diagnosis not present

## 2018-09-04 LAB — POCT GLYCOSYLATED HEMOGLOBIN (HGB A1C): Hemoglobin A1C: 5.8 % — AB (ref 4.0–5.6)

## 2018-09-04 NOTE — Patient Instructions (Addendum)
You can call for a mammogram at the [facility] [address] [place] [address] [place]  Defer bone density test at this point.  Update me as needed.  Rest and fluids.   Take care.  Glad to see you.

## 2018-09-04 NOTE — Progress Notes (Signed)
Subjective:   Theresa Eaton is a 67 y.o. female who presents for Medicare Annual (Subsequent) preventive examination.  Review of Systems:  N/A Cardiac Risk Factors include: advanced age (>28men, >50 women);dyslipidemia;hypertension;obesity (BMI >30kg/m2)     Objective:     Vitals: BP 118/82 (BP Location: Right Arm, Patient Position: Sitting, Cuff Size: Normal)   Pulse 62   Temp 98.3 F (36.8 C) (Oral)   Ht 5\' 7"  (1.702 m) Comment: no shoes  Wt 205 lb 8 oz (93.2 kg)   SpO2 99%   BMI 32.19 kg/m   Body mass index is 32.19 kg/m.  Advanced Directives 09/04/2018 08/23/2017 06/30/2017  Does Patient Have a Medical Advance Directive? Yes Yes Yes  Type of Estate agent of [place];Living will Healthcare Power of [place];Living will Healthcare Power of [place];Living will  Copy of Healthcare Power of Attorney in Chart? No - copy requested - No - copy requested    Tobacco Social History   Tobacco Use  Smoking Status Never Smoker  Smokeless Tobacco Never Used     Counseling given: No   Clinical Intake:  Pre-visit preparation completed: Yes  Pain : No/denies pain Pain Score: 0-No pain     Nutritional Status: BMI > 30  Obese Nutritional Risks: None Diabetes: No  How often do you need to have someone help you when you read instructions, pamphlets, or other written materials from your doctor or pharmacy?: 1 - Never What is the last grade level you completed in school?: Master degree (two)  Interpreter Needed?: No  Comments: pt lives with spouse Information entered by :: LPinson, LPN  Past Medical History:  Diagnosis Date  . Arthritis    bursitis in lt. and rt.  . Chicken pox   . Diabetes mellitus without complication (HCC)    history of no longer since 2015  . GERD (gastroesophageal reflux disease)   . Hyperlipidemia    in the past no longer has  . Osteopenia    started on fosamax 2013   Past Surgical History:  Procedure Laterality  Date  . COLONOSCOPY  10/25/2005   [place]/Normal    . Laser closure of greater saphenous vein, Left  2015  . TUBAL LIGATION     Family History  Problem Relation Age of Onset  . Hyperlipidemia Mother   . Dementia Mother   . Cancer Father        prostate  . Osteoporosis Father   . Cancer Sister        breast  . Diabetes Sister   . Breast cancer Sister   . Osteoporosis Brother   . Osteoporosis Maternal Aunt   . Diabetes Maternal Grandmother   . Heart disease Maternal Grandfather   . Heart disease Paternal Grandfather   . Colon cancer Neg Hx   . Colon polyps Neg Hx   . Esophageal cancer Neg Hx   . Stomach cancer Neg Hx   . Rectal cancer Neg Hx    Social History   Socioeconomic History  . Marital status: Married    Spouse name: Not on file  . Number of children: Not on file  . Years of education: Not on file  . Highest education level: Not on file  Occupational History  . Not on file  Social Needs  . Financial resource strain: Not on file  . Food insecurity:    Worry: Not on file    Inability: Not on file  . Transportation needs:    Medical:  Not on file    Non-medical: Not on file  Tobacco Use  . Smoking status: Never Smoker  . Smokeless tobacco: Never Used  Substance and Sexual Activity  . Alcohol use: Yes    Alcohol/week: 4.0 standard drinks    Types: 4 Glasses of wine per week    Comment: 1 glass of wine with a meal 4x/wk  . Drug use: No  . Sexual activity: Not on file  Lifestyle  . Physical activity:    Days per week: Not on file    Minutes per session: Not on file  . Stress: Not on file  Relationships  . Social connections:    Talks on phone: Not on file    Gets together: Not on file    Attends religious service: Not on file    Active member of club or organization: Not on file    Attends meetings of clubs or organizations: Not on file    Relationship status: Not on file  Other Topics Concern  . Not on file  Social History Narrative   MDIV,  Manfred Shirts priest   Married 1973   3 kids- her daughter is a family medicine MD (med school at [employer]).    Outpatient Encounter Medications as of 09/04/2018  Medication Sig  . Multiple Vitamins-Minerals (CENTRUM SILVER ADULT 50+ PO) Take 1 tablet by mouth daily.  . [DISCONTINUED] acetaZOLAMIDE (DIAMOX) 125 MG tablet Take 1 tablet (125 mg total) by mouth 2 (two) times daily.  . [DISCONTINUED] azithromycin (ZITHROMAX) 500 MG tablet Take 500mg  a day for up to 3 days if needed for diarrhea.  Disp 2 course of treatment.   No facility-administered encounter medications on file as of 09/04/2018.     Activities of Daily Living In your present state of health, do you have any difficulty performing the following activities: 09/04/2018  Hearing? N  Vision? N  Difficulty concentrating or making decisions? N  Walking or climbing stairs? N  Dressing or bathing? N  Doing errands, shopping? N  Preparing Food and eating ? N  Using the Toilet? N  In the past six months, have you accidently leaked urine? N  Do you have problems with loss of bowel control? N  Managing your Medications? N  Managing your Finances? N  Housekeeping or managing your Housekeeping? N  Some recent data might be hidden    Patient Care Team: Joaquim Nam, MD as PCP - General (Family Medicine)    Assessment:   This is a routine wellness examination for [name].  Exercise Activities and Dietary recommendations Current Exercise Habits: Home exercise routine, Type of exercise: walking;strength training/weights;stretching, Time (Minutes): 45, Frequency (Times/Week): 7, Weekly Exercise (Minutes/Week): 315, Intensity: Moderate, Exercise limited by: None identified  Goals    . Increase physical activity     Starting 09/04/2018, I will continue to exercise at least 45 minutes daily.        Fall Risk Fall Risk  09/04/2018 06/30/2017 01/27/2016 09/16/2014  Falls in the past year? 0 No No No   Depression Screen PHQ 2/9 Scores  09/04/2018 06/30/2017 01/27/2016 09/16/2014  PHQ - 2 Score 0 0 0 0  PHQ- 9 Score 0 0 - -     Cognitive Function MMSE - Mini Mental State Exam 09/04/2018 06/30/2017  Orientation to time 5 5  Orientation to Place 5 5  Registration 3 3  Attention/ Calculation 0 0  Recall 3 3  Language- name 2 objects 0 0  Language- repeat 1 1  Language- follow 3 step command 3 3  Language- read & follow direction 0 0  Write a sentence 0 0  Copy design 0 0  Total score 20 20     PLEASE NOTE: A Mini-Cog screen was completed. Maximum score is 20. A value of 0 denotes this part of [place] MMSE was not completed or the patient failed this part of the Mini-Cog screening.   Mini-Cog Screening Orientation to Time - Max 5 pts Orientation to Place - Max 5 pts Registration - Max 3 pts Recall - Max 3 pts Language Repeat - Max 1 pts Language Follow 3 Step Command - Max 3 pts     Immunization History  Administered Date(s) Administered  . Hep A / Hep B 07/10/2018, 08/15/2018  . Influenza, High Dose Seasonal PF 07/03/2018  . Influenza,inj,Quad PF,6+ Mos 08/23/2013, 09/16/2014  . Influenza-Unspecified 07/25/2016, 07/03/2018  . Pneumococcal Conjugate-13 09/16/2014  . Pneumococcal Polysaccharide-23 01/27/2016  . Td 10/25/2012  . Typhoid Live 06/30/2017  . Zoster 08/27/2003    Screening Tests Health Maintenance  Topic Date Due  . MAMMOGRAM  11/25/2019  . TETANUS/TDAP  10/25/2022  . COLONOSCOPY  09/07/2027  . INFLUENZA VACCINE  Completed  . DEXA SCAN  Completed  . Hepatitis C Screening  Completed  . PNA vac Low Risk Adult  Completed      Plan:     I have personally reviewed, addressed, and noted the following in the patient's chart:  A. Medical and social history B. Use of alcohol, tobacco or illicit drugs  C. Current medications and supplements D. Functional ability and status E.  Nutritional status F.  Physical activity G. Advance directives H. List of other physicians I.  Hospitalizations,  surgeries, and ER visits in previous 12 months J.  Vitals K. Screenings to include hearing, vision, cognitive, depression L. Referrals and appointments - none  In addition, I have reviewed and discussed with patient certain preventive protocols, quality metrics, and best practice recommendations. A written personalized care plan for preventive services as well as general preventive health recommendations were provided to patient.  See attached scanned questionnaire for additional information.   Signed,   Randa Evens, MHA, BS, LPN Health Coach

## 2018-09-04 NOTE — Progress Notes (Signed)
She had a good trip to [place].  She got a cold likely on the way back, on the plane.  She didn't have trouble with altitude sickness, with diamox.  She didn't have traveller's diarrhea.  It was a successful trip o/w.  She just back about 1.5 days ago.    ST started about 2 days ago.  Mild aches but that could have been from hiking.  No fevers.  Some rhinorrhea.  No ear pain. Some cough. No vomiting.  No sputum.    Flu 2019 Shingles 2004 PNA UTD Tetanus 2014 Colonoscopy 2018 Breast cancer screening- mammogram 2017, d/w pt.  See avs.   Pap not due.   Advance directive- daughter Carollee Herter designated if patient were incapacitated.    H/o DM2.  H/o HLD.  Statin intolerant.   Recent sugar elevation d/w pt.  POC A1c done today.  Discussed with patient at office visit.  Osteopenia.  She had 5 years of fosamax.  Last DXA 2017.  Vit D wnl, d/w pt.  We talked about options on recheck DXA and it may be reasonable to defer DXA in 2019.  She wanted to defer at this point.    Meds, vitals, and allergies reviewed.   ROS: Per HPI unless specifically indicated in ROS section   GEN: nad, alert and oriented HEENT: mucous membranes moist, tm w/o erythema, nasal exam w/o erythema, clear discharge noted,  OP with cobblestoning NECK: supple w/o LA CV: rrr.   PULM: ctab, no inc wob EXT: no edema SKIN: well perfused.

## 2018-09-04 NOTE — Progress Notes (Signed)
PCP notes:   Health maintenance:  No gaps identified.  Abnormal screenings:   None  Patient concerns:   Patient reports sore throat and runny nose after recent flight from [place]. PCP notified.  Nurse concerns:  None  Next PCP appt:   09/04/18 @ 1400  I reviewed health advisor's note, was available for consultation on the day of service listed in this note, and agree with documentation and plan. Crawford Givens, MD.

## 2018-09-04 NOTE — Patient Instructions (Addendum)
Theresa Eaton , Thank you for taking time to come for your Medicare Wellness Visit. I appreciate your ongoing commitment to your health goals. Please review the following plan we discussed and let me know if I can assist you in the future.   These are the goals we discussed: Goals    . Increase physical activity     Starting 09/04/2018, I will continue to exercise at least 45 minutes daily.        This is a list of the screening recommended for you and due dates:  Health Maintenance  Topic Date Due  . Mammogram  11/25/2019  . Tetanus Vaccine  10/25/2022  . Colon Cancer Screening  09/07/2027  . Flu Shot  Completed  . DEXA scan (bone density measurement)  Completed  .  Hepatitis C: One time screening is recommended by Center for Disease Control  (CDC) for  adults born from [birth year] through 1967.   Completed  . Pneumonia vaccines  Completed   Preventive Care for Adults  A healthy lifestyle and preventive care can promote health and wellness. Preventive health guidelines for adults include the following key practices.  . A routine yearly physical is a good way to check with your health care provider about your health and preventive screening. It is a chance to share any concerns and updates on your health and to receive a thorough exam.  . Visit your dentist for a routine exam and preventive care every 6 months. Brush your teeth twice a day and floss once a day. Good oral hygiene prevents tooth decay and gum disease.  . The frequency of eye exams is based on your age, health, family medical history, use  of contact lenses, and other factors. Follow your health care provider's recommendations for frequency of eye exams.  . Eat a healthy diet. Foods like vegetables, fruits, whole grains, low-fat dairy products, and lean protein foods contain the nutrients you need without too many calories. Decrease your intake of foods high in solid fats, added sugars, and salt. Eat the right amount of calories  for you. Get information about a proper diet from your health care provider, if necessary.  . Regular physical exercise is one of the most important things you can do for your health. Most adults should get at least 150 minutes of moderate-intensity exercise (any activity that increases your heart rate and causes you to sweat) each week. In addition, most adults need muscle-strengthening exercises on 2 or more days a week.  Silver Sneakers may be a benefit available to you. To determine eligibility, you may visit the website: www.silversneakers.com or contact program at (303)765-3874 Mon-Fri between 8AM-8PM.   . Maintain a healthy weight. The body mass index (BMI) is a screening tool to identify possible weight problems. It provides an estimate of body fat based on height and weight. Your health care provider can find your BMI and can help you achieve or maintain a healthy weight.   For adults 20 years and older: ? A BMI below 18.5 is considered underweight. ? A BMI of 18.5 to 24.9 is normal. ? A BMI of 25 to 29.9 is considered overweight. ? A BMI of 30 and above is considered obese.   . Maintain normal blood lipids and cholesterol levels by exercising and minimizing your intake of saturated fat. Eat a balanced diet with plenty of fruit and vegetables. Blood tests for lipids and cholesterol should begin at age 25 and be repeated every 5 years. If your  lipid or cholesterol levels are high, you are over 50, or you are at high risk for heart disease, you may need your cholesterol levels checked more frequently. Ongoing high lipid and cholesterol levels should be treated with medicines if diet and exercise are not working.  . If you smoke, find out from your health care provider how to quit. If you do not use tobacco, please do not start.  . If you choose to drink alcohol, please do not consume more than 2 drinks per day. One drink is considered to be 12 ounces (355 mL) of beer, 5 ounces (148 mL) of  wine, or 1.5 ounces (44 mL) of liquor.  . If you are 54-25 years old, ask your health care provider if you should take aspirin to prevent strokes.  . Use sunscreen. Apply sunscreen liberally and repeatedly throughout the day. You should seek shade when your shadow is shorter than you. Protect yourself by wearing long sleeves, pants, a wide-brimmed hat, and sunglasses year round, whenever you are outdoors.  . Once a month, do a whole body skin exam, using a mirror to look at the skin on your back. Tell your health care provider of new moles, moles that have irregular borders, moles that are larger than a pencil eraser, or moles that have changed in shape or color.

## 2018-09-05 DIAGNOSIS — Z0000 Encounter for general adult medical examination without abnormal findings: Secondary | ICD-10-CM | POA: Insufficient documentation

## 2018-09-05 DIAGNOSIS — J029 Acute pharyngitis, unspecified: Secondary | ICD-10-CM | POA: Insufficient documentation

## 2018-09-05 NOTE — Assessment & Plan Note (Addendum)
She had 5 years of fosamax.  Last DXA 2017.  Vit D wnl, d/w pt.  We talked about options on recheck DXA and it may be reasonable to defer DXA in 2019.  She wanted to defer further imaging or treatment at this point.  I agree with her. >25 minutes spent in face to face time with patient, >50% spent in counselling or coordination of care.

## 2018-09-05 NOTE — Assessment & Plan Note (Signed)
Flu 2019 Shingles 2004 PNA UTD Tetanus 2014 Colonoscopy 2018 Breast cancer screening- mammogram 2017, d/w pt.  See avs.   Pap not due.   Advance directive- daughter Carollee HerterShannon designated if patient were incapacitated.

## 2018-09-05 NOTE — Assessment & Plan Note (Signed)
Advance directive-daughter Shannon designated if patient were incapacitated. 

## 2018-09-05 NOTE — Assessment & Plan Note (Signed)
Recent sugar elevation d/w pt.  POC A1c done today.  Discussed with patient at office visit.  Continue work on diet and exercise.  Update me as needed.  She agrees.

## 2018-09-05 NOTE — Assessment & Plan Note (Signed)
H/o HLD.  Statin intolerant.   Continue work on diet and exercise.

## 2018-09-05 NOTE — Assessment & Plan Note (Signed)
Appears to be a benign viral issue.  Discussed with patient about rest and fluids.  Update me as needed.  She agrees.

## 2018-09-12 ENCOUNTER — Other Ambulatory Visit: Payer: Self-pay

## 2018-12-30 ENCOUNTER — Encounter: Payer: Self-pay | Admitting: Family Medicine

## 2019-07-11 DIAGNOSIS — Z23 Encounter for immunization: Secondary | ICD-10-CM | POA: Diagnosis not present

## 2019-09-10 ENCOUNTER — Ambulatory Visit (INDEPENDENT_AMBULATORY_CARE_PROVIDER_SITE_OTHER): Payer: Medicare Other

## 2019-09-10 VITALS — Wt 202.0 lb

## 2019-09-10 DIAGNOSIS — Z0000 Encounter for general adult medical examination without abnormal findings: Secondary | ICD-10-CM

## 2019-09-10 NOTE — Progress Notes (Signed)
PCP notes:  Health Maintenance: Declined mammogram during COVID   Abnormal Screenings: none   Patient concerns: none   Nurse concerns: none   Next PCP appt.: 09/11/2019 @ 12:15 pm

## 2019-09-10 NOTE — Patient Instructions (Signed)
Theresa Eaton , Thank you for taking time to come for your Medicare Wellness Visit. I appreciate your ongoing commitment to your health goals. Please review the following plan we discussed and let me know if I can assist you in the future.   Screening recommendations/referrals: Colonoscopy: Up to date, completed 09/06/2017 Mammogram: declined Bone Density: Up to date, completed 03/02/2016 Recommended yearly ophthalmology/optometry visit for glaucoma screening and checkup Recommended yearly dental visit for hygiene and checkup  Vaccinations: Influenza vaccine: Up to date, completed 07/11/2019 Pneumococcal vaccine: Completed series Tdap vaccine: Up to date, completed 10/25/2012 Shingles vaccine: decline    Advanced directives: Please bring a copy of your POA (Power of [place]) and/or Living Will to your next appointment.   Conditions/risks identified: hyperlipidemia  Next appointment: 09/11/2019 @ 12:15 pm    Preventive Care 70 Years and Older, Female Preventive care refers to lifestyle choices and visits with your health care provider that can promote health and wellness. What does preventive care include?  A yearly physical exam. This is also called an annual well check.  Dental exams once or twice a year.  Routine eye exams. Ask your health care provider how often you should have your eyes checked.  Personal lifestyle choices, including:  Daily care of your teeth and gums.  Regular physical activity.  Eating a healthy diet.  Avoiding tobacco and drug use.  Limiting alcohol use.  Practicing safe sex.  Taking low-dose aspirin every day.  Taking vitamin and mineral supplements as recommended by your health care provider. What happens during an annual well check? The services and screenings done by your health care provider during your annual well check will depend on your age, overall health, lifestyle risk factors, and family history of disease. Counseling  Your health  care provider may ask you questions about your:  Alcohol use.  Tobacco use.  Drug use.  Emotional well-being.  Home and relationship well-being.  Sexual activity.  Eating habits.  History of falls.  Memory and ability to understand (cognition).  Work and work Statistician.  Reproductive health. Screening  You may have the following tests or measurements:  Height, weight, and BMI.  Blood pressure.  Lipid and cholesterol levels. These may be checked every 5 years, or more frequently if you are over 49 years old.  Skin check.  Lung cancer screening. You may have this screening every year starting at age 70 if you have a 30-pack-year history of smoking and currently smoke or have quit within the past 15 years.  Fecal occult blood test (FOBT) of the stool. You may have this test every year starting at age 70.  Flexible sigmoidoscopy or colonoscopy. You may have a sigmoidoscopy every 5 years or a colonoscopy every 10 years starting at age 70.  Hepatitis C blood test.  Hepatitis B blood test.  Sexually transmitted disease (STD) testing.  Diabetes screening. This is done by checking your blood sugar (glucose) after you have not eaten for a while (fasting). You may have this done every 1-3 years.  Bone density scan. This is done to screen for osteoporosis. You may have this done starting at age 70.  Mammogram. This may be done every 1-2 years. Talk to your health care provider about how often you should have regular mammograms. Talk with your health care provider about your test results, treatment options, and if necessary, the need for more tests. Vaccines  Your health care provider may recommend certain vaccines, such as:  Influenza vaccine. This is  recommended every year.  Tetanus, diphtheria, and acellular pertussis (Tdap, Td) vaccine. You may need a Td booster every 10 years.  Zoster vaccine. You may need this after age 68.  Pneumococcal 13-valent conjugate  (PCV13) vaccine. One dose is recommended after age 63.  Pneumococcal polysaccharide (PPSV23) vaccine. One dose is recommended after age 9. Talk to your health care provider about which screenings and vaccines you need and how often you need them. This information is not intended to replace advice given to you by your health care provider. Make sure you discuss any questions you have with your health care provider. Document Released: 11/07/2015 Document Revised: 06/30/2016 Document Reviewed: 08/12/2015 Elsevier Interactive Patient Education  2017 [place] Prevention in the Home Falls can cause injuries. They can happen to people of all ages. There are many things you can do to make your home safe and to help prevent falls. What can I do on the outside of my home?  Regularly fix the edges of walkways and driveways and fix any cracks.  Remove anything that might make you trip as you walk through a door, such as a raised step or threshold.  Trim any bushes or trees on the path to your home.  Use bright outdoor lighting.  Clear any walking paths of anything that might make someone trip, such as rocks or tools.  Regularly check to see if handrails are loose or broken. Make sure that both sides of any steps have handrails.  Any raised decks and porches should have guardrails on the edges.  Have any leaves, snow, or ice cleared regularly.  Use sand or salt on walking paths during winter.  Clean up any spills in your garage right away. This includes oil or grease spills. What can I do in the bathroom?  Use night lights.  Install grab bars by the toilet and in the tub and shower. Do not use towel bars as grab bars.  Use non-skid mats or decals in the tub or shower.  If you need to sit down in the shower, use a plastic, non-slip stool.  Keep the floor dry. Clean up any water that spills on the floor as soon as it happens.  Remove soap buildup in the tub or shower  regularly.  Attach bath mats securely with double-sided non-slip rug tape.  Do not have throw rugs and other things on the floor that can make you trip. What can I do in the bedroom?  Use night lights.  Make sure that you have a light by your bed that is easy to reach.  Do not use any sheets or blankets that are too big for your bed. They should not hang down onto the floor.  Have a firm chair that has side arms. You can use this for support while you get dressed.  Do not have throw rugs and other things on the floor that can make you trip. What can I do in the kitchen?  Clean up any spills right away.  Avoid walking on wet floors.  Keep items that you use a lot in easy-to-reach places.  If you need to reach something above you, use a strong step stool that has a grab bar.  Keep electrical cords out of the way.  Do not use floor polish or wax that makes floors slippery. If you must use wax, use non-skid floor wax.  Do not have throw rugs and other things on the floor that can make you trip.  What can I do with my stairs?  Do not leave any items on the stairs.  Make sure that there are handrails on both sides of the stairs and use them. Fix handrails that are broken or loose. Make sure that handrails are as long as the stairways.  Check any carpeting to make sure that it is firmly attached to the stairs. Fix any carpet that is loose or worn.  Avoid having throw rugs at the top or bottom of the stairs. If you do have throw rugs, attach them to the floor with carpet tape.  Make sure that you have a light switch at the top of the stairs and the bottom of the stairs. If you do not have them, ask someone to add them for you. What else can I do to help prevent falls?  Wear shoes that:  Do not have high heels.  Have rubber bottoms.  Are comfortable and fit you well.  Are closed at the toe. Do not wear sandals.  If you use a stepladder:  Make sure that it is fully  opened. Do not climb a closed stepladder.  Make sure that both sides of the stepladder are locked into place.  Ask someone to hold it for you, if possible.  Clearly mark and make sure that you can see:  Any grab bars or handrails.  First and last steps.  Where the edge of each step is.  Use tools that help you move around (mobility aids) if they are needed. These include:  Canes.  Walkers.  Scooters.  Crutches.  Turn on the lights when you go into a dark area. Replace any light bulbs as soon as they burn out.  Set up your furniture so you have a clear path. Avoid moving your furniture around.  If any of your floors are uneven, fix them.  If there are any pets around you, be aware of where they are.  Review your medicines with your doctor. Some medicines can make you feel dizzy. This can increase your chance of falling. Ask your doctor what other things that you can do to help prevent falls. This information is not intended to replace advice given to you by your health care provider. Make sure you discuss any questions you have with your health care provider. Document Released: 08/07/2009 Document Revised: 03/18/2016 Document Reviewed: 11/15/2014 Elsevier Interactive Patient Education  2017 [employer].

## 2019-09-10 NOTE — Progress Notes (Signed)
Subjective:   Theresa Eaton is a 68 y.o. female who presents for Medicare Annual (Subsequent) preventive examination.  Review of Systems: N/A   This visit is being conducted through telemedicine via telephone at the nurse health advisor's home address due to the COVID-19 pandemic. This patient has given me verbal consent via doximity to conduct this visit, patient states they are participating from their home address. Patient and myself are on the telephone call. There is no referral for this visit. Some vital signs may be absent or patient reported.    Patient identification: identified by name, DOB, and current address   Cardiac Risk Factors include: advanced age (>65men, >52 women);dyslipidemia     Objective:     Vitals: Wt 202 lb (91.6 kg)   BMI 31.64 kg/m   Body mass index is 31.64 kg/m.  Advanced Directives 09/10/2019 09/04/2018 08/23/2017 06/30/2017  Does Patient Have a Medical Advance Directive? Yes Yes Yes Yes  Type of Estate agent of [place];Living will Healthcare Power of [place];Living will Healthcare Power of [place];Living will Healthcare Power of [place];Living will  Copy of Healthcare Power of Attorney in Chart? No - copy requested No - copy requested - No - copy requested    Tobacco Social History   Tobacco Use  Smoking Status Never Smoker  Smokeless Tobacco Never Used     Counseling given: Not Answered   Clinical Intake:  Pre-visit preparation completed: Yes  Pain : 0-10 Pain Score: 4  Pain Type: Chronic pain Pain Location: Leg Pain Orientation: Left Pain Descriptors / Indicators: Aching Pain Onset: More than a month ago Pain Frequency: Intermittent     Nutritional Risks: None Diabetes: No  How often do you need to have someone help you when you read instructions, pamphlets, or other written materials from your doctor or pharmacy?: 1 - Never What is the last grade level you completed in school?: masters   Interpreter Needed?: No  Information entered by :: CJohnson, LPN  Past Medical History:  Diagnosis Date  . Arthritis    bursitis in lt. and rt.  . Chicken pox   . Diabetes mellitus without complication (HCC)    history of no longer since 2015  . GERD (gastroesophageal reflux disease)   . Hyperlipidemia    in the past no longer has  . Osteopenia    started on fosamax 2013   Past Surgical History:  Procedure Laterality Date  . COLONOSCOPY  10/25/2005   [place]/Normal    . Laser closure of greater saphenous vein, Left  2015  . TUBAL LIGATION     Family History  Problem Relation Age of Onset  . Hyperlipidemia Mother   . Dementia Mother   . Cancer Father        prostate  . Osteoporosis Father   . Cancer Sister        breast  . Diabetes Sister   . Breast cancer Sister   . Osteoporosis Brother   . Osteoporosis Maternal Aunt   . Diabetes Maternal Grandmother   . Heart disease Maternal Grandfather   . Heart disease Paternal Grandfather   . Colon cancer Neg Hx   . Colon polyps Neg Hx   . Esophageal cancer Neg Hx   . Stomach cancer Neg Hx   . Rectal cancer Neg Hx    Social History   Socioeconomic History  . Marital status: Married    Spouse name: Not on file  . Number of children: Not on file  .  Years of education: Not on file  . Highest education level: Not on file  Occupational History  . Not on file  Social Needs  . Financial resource strain: Not hard at all  . Food insecurity    Worry: Never true    Inability: Never true  . Transportation needs    Medical: No    Non-medical: No  Tobacco Use  . Smoking status: Never Smoker  . Smokeless tobacco: Never Used  Substance and Sexual Activity  . Alcohol use: Yes    Alcohol/week: 4.0 standard drinks    Types: 4 Glasses of wine per week    Comment: 1 glass of wine with a meal 4x/wk  . Drug use: No  . Sexual activity: Not on file  Lifestyle  . Physical activity    Days per week: 0 days    Minutes per  session: 0 min  . Stress: Not at all  Relationships  . Social Musicianconnections    Talks on phone: Not on file    Gets together: Not on file    Attends religious service: Not on file    Active member of club or organization: Not on file    Attends meetings of clubs or organizations: Not on file    Relationship status: Not on file  Other Topics Concern  . Not on file  Social History Narrative   MDIV, Manfred Shirtspiscopal priest   Married 1973   3 kids- her daughter is a family medicine MD (med school at [employer]).    Outpatient Encounter Medications as of 09/10/2019  Medication Sig  . Multiple Vitamins-Minerals (CENTRUM SILVER ADULT 50+ PO) Take 1 tablet by mouth daily.  . naproxen sodium (ALEVE) 220 MG tablet Take 220 mg by mouth 2 (two) times daily as needed.   No facility-administered encounter medications on file as of 09/10/2019.     Activities of Daily Living In your present state of health, do you have any difficulty performing the following activities: 09/10/2019  Hearing? N  Vision? N  Difficulty concentrating or making decisions? N  Walking or climbing stairs? N  Dressing or bathing? N  Doing errands, shopping? N  Preparing Food and eating ? N  Using the Toilet? N  In the past six months, have you accidently leaked urine? N  Do you have problems with loss of bowel control? N  Managing your Medications? N  Managing your Finances? N  Housekeeping or managing your Housekeeping? N  Some recent data might be hidden    Patient Care Team: Joaquim Namuncan, Graham S, MD as PCP - General (Family Medicine)    Assessment:   This is a routine wellness examination for GambierBarbara.  Exercise Activities and Dietary recommendations Current Exercise Habits: The patient does not participate in regular exercise at present, Exercise limited by: None identified  Goals    . Increase physical activity     Starting 09/04/2018, I will continue to exercise at least 45 minutes daily.     . Patient Stated      09/10/2019, I will increase exercise more on a daily basis.       Fall Risk Fall Risk  09/10/2019 09/12/2018 09/04/2018 06/30/2017 01/27/2016  Falls in the past year? 0 0 0 No No  Comment - Emmi Telephone Survey: data to providers prior to load - - -  Number falls in past yr: 0 - - - -  Injury with Fall? 0 - - - -  Follow up Falls evaluation completed;Falls prevention discussed - - - -  Is the patient's home free of loose throw rugs in walkways, pet beds, electrical cords, etc?   yes      Grab bars in the bathroom? yes      Handrails on the stairs?   yes      Adequate lighting?   yes  Timed Get Up and Go performed: N/A  Depression Screen PHQ 2/9 Scores 09/10/2019 09/04/2018 06/30/2017 01/27/2016  PHQ - 2 Score 0 0 0 0  PHQ- 9 Score 0 0 0 -     Cognitive Function MMSE - Mini Mental State Exam 09/10/2019 09/04/2018 06/30/2017  Orientation to time 5 5 5   Orientation to Place 5 5 5   Registration 3 3 3   Attention/ Calculation 5 0 0  Recall 3 3 3   Language- name 2 objects - 0 0  Language- repeat 1 1 1   Language- follow 3 step command - 3 3  Language- read & follow direction - 0 0  Write a sentence - 0 0  Copy design - 0 0  Total score - 20 20  Mini Cog  Mini-Cog screen was completed. Maximum score is 22. A value of 0 denotes this part of the MMSE was not completed or the patient failed this part of the Mini-Cog screening.       Immunization History  Administered Date(s) Administered  . Fluad Quad(high Dose 65+) 07/11/2019  . Hep A / Hep B 07/10/2018, 08/15/2018  . Influenza, High Dose Seasonal PF 07/03/2018  . Influenza,inj,Quad PF,6+ Mos 08/23/2013, 09/16/2014  . Influenza-Unspecified 07/25/2016, 07/03/2018  . Pneumococcal Conjugate-13 09/16/2014  . Pneumococcal Polysaccharide-23 01/27/2016  . Td 10/25/2012  . Typhoid Live 06/30/2017  . Zoster 08/27/2003    Qualifies for Shingles Vaccine? yes  Screening Tests Health Maintenance  Topic Date Due  . MAMMOGRAM   11/25/2019  . TETANUS/TDAP  10/25/2022  . COLONOSCOPY  09/07/2027  . INFLUENZA VACCINE  Completed  . DEXA SCAN  Completed  . Hepatitis C Screening  Completed  . PNA vac Low Risk Adult  Completed    Cancer Screenings: Lung: Low Dose CT Chest recommended if Age 21-80 years, 30 pack-year currently smoking OR have quit w/in 15years. Patient does not qualify. Breast:  Up to date on Mammogram? No, declined will do after COVID   Up to date of Bone Density/Dexa? Yes, completed 03/02/2016 Colorectal: completed 09/06/2017  Additional Screenings:  Hepatitis C Screening: 01/27/2016     Plan:    Patient will increase exercise daily.    I have personally reviewed and noted the following in the patient's chart:   . Medical and social history . Use of alcohol, tobacco or illicit drugs  . Current medications and supplements . Functional ability and status . Nutritional status . Physical activity . Advanced directives . List of other physicians . Hospitalizations, surgeries, and ER visits in previous 12 months . Vitals . Screenings to include cognitive, depression, and falls . Referrals and appointments  In addition, I have reviewed and discussed with patient certain preventive protocols, quality metrics, and best practice recommendations. A written personalized care plan for preventive services as well as general preventive health recommendations were provided to patient.     Andrez Grime, LPN  [id number]

## 2019-09-11 ENCOUNTER — Encounter: Payer: Self-pay | Admitting: Family Medicine

## 2019-09-11 ENCOUNTER — Other Ambulatory Visit: Payer: Self-pay

## 2019-09-11 ENCOUNTER — Ambulatory Visit: Payer: Medicare Other

## 2019-09-11 ENCOUNTER — Ambulatory Visit (INDEPENDENT_AMBULATORY_CARE_PROVIDER_SITE_OTHER): Payer: Medicare Other | Admitting: Family Medicine

## 2019-09-11 VITALS — BP 110/82 | HR 62 | Temp 97.2°F | Ht 67.0 in | Wt 202.2 lb

## 2019-09-11 DIAGNOSIS — Z0000 Encounter for general adult medical examination without abnormal findings: Secondary | ICD-10-CM

## 2019-09-11 DIAGNOSIS — M5430 Sciatica, unspecified side: Secondary | ICD-10-CM | POA: Diagnosis not present

## 2019-09-11 DIAGNOSIS — R739 Hyperglycemia, unspecified: Secondary | ICD-10-CM

## 2019-09-11 DIAGNOSIS — E785 Hyperlipidemia, unspecified: Secondary | ICD-10-CM | POA: Diagnosis not present

## 2019-09-11 DIAGNOSIS — M8580 Other specified disorders of bone density and structure, unspecified site: Secondary | ICD-10-CM

## 2019-09-11 DIAGNOSIS — M899 Disorder of bone, unspecified: Secondary | ICD-10-CM | POA: Diagnosis not present

## 2019-09-11 DIAGNOSIS — Z7189 Other specified counseling: Secondary | ICD-10-CM

## 2019-09-11 LAB — COMPREHENSIVE METABOLIC PANEL
ALT: 13 U/L (ref 0–35)
AST: 18 U/L (ref 0–37)
Albumin: 4.5 g/dL (ref 3.5–5.2)
Alkaline Phosphatase: 59 U/L (ref 39–117)
BUN: 18 mg/dL (ref 6–23)
CO2: 32 mEq/L (ref 19–32)
Calcium: 9.8 mg/dL (ref 8.4–10.5)
Chloride: 103 mEq/L (ref 96–112)
Creatinine, Ser: 0.72 mg/dL (ref 0.40–1.20)
GFR: 79.92 mL/min (ref >60.00)
Glucose, Bld: 106 mg/dL — ABNORMAL HIGH (ref 70–99)
Potassium: 4.7 mEq/L (ref 3.5–5.1)
Sodium: 141 mEq/L (ref 135–145)
Total Bilirubin: 0.6 mg/dL (ref 0.2–1.2)
Total Protein: 7.1 g/dL (ref 6.0–8.3)

## 2019-09-11 LAB — LIPID PANEL
Cholesterol: 267 mg/dL — ABNORMAL HIGH (ref 0–200)
HDL: 63.7 mg/dL (ref >39.00)
LDL Cholesterol: 176 mg/dL — ABNORMAL HIGH (ref 0–99)
NonHDL: 203.69
Total CHOL/HDL Ratio: 4
Triglycerides: 136 mg/dL (ref 0.0–149.0)
VLDL: 27.2 mg/dL (ref 0.0–40.0)

## 2019-09-11 LAB — HEMOGLOBIN A1C: Hgb A1c MFr Bld: 5.9 % (ref 4.6–6.5)

## 2019-09-11 LAB — VITAMIN D 25 HYDROXY (VIT D DEFICIENCY, FRACTURES): VITD: 34.37 ng/mL (ref 30.00–100.00)

## 2019-09-11 MED ORDER — GABAPENTIN 100 MG PO CAPS: 100.0000 mg | ORAL_CAPSULE | Freq: Three times a day (TID) | ORAL | 0 refills | Status: DC | PRN

## 2019-09-11 MED ORDER — PREDNISONE 10 MG PO TABS: ORAL_TABLET | ORAL | 0 refills | Status: DC

## 2019-09-11 NOTE — Patient Instructions (Addendum)
Check with your insurance to see if they will cover the shingrix shot. Go to the lab on the way out.  We'll contact you with your lab report. Use the home back exercises.   Take care.  Glad to see you.  Use the prednisone and gabapentin if needed.   Sedation caution on the gabapentin.

## 2019-09-11 NOTE — Progress Notes (Signed)
Colonoscopy: Up to date, completed 09/06/2017 Mammogram: declined, discussed with patient regarding pandemic. Bone Density: Up to date, completed 03/02/2016, declined 2020 given pandemic. Recommended yearly ophthalmology/optometry visit for glaucoma screening and checkup Recommended yearly dental visit for hygiene and checkup  Vaccinations: Influenza vaccine: Up to date, completed 07/11/2019 Pneumococcal vaccine: Completed series Tdap vaccine: Up to date, completed 10/25/2012 Shingles vaccine: d/w pt.     Advance directive- daughter Larene [place] designated if patient were incapacitated.   H/o hyperglycemia.  Due for f/u labs.  She is working on diet.  Exercise is limited as below.  Exercise limited by L sciatica.  Pain weight bearing.  Started a few months ago.  No R leg sx.  No B/B sx.  Can radiate to the L foot.  No FCNAVD.  No saddle anesthesia.  No foot drop.  No trauma.  No rash.  Taking aleve but it didn't help much prev.    Meds, vitals, and allergies reviewed.   ROS: Per HPI unless specifically indicated in ROS section   GEN: nad, alert and oriented HEENT: ncat NECK: supple w/o LA CV: rrr. PULM: ctab, no inc wob ABD: soft, +bs EXT: no edema SKIN: no acute rash Strength and sensation grossly wnl BLE with negative straight leg raise bilaterally.

## 2019-09-13 DIAGNOSIS — M5430 Sciatica, unspecified side: Secondary | ICD-10-CM | POA: Insufficient documentation

## 2019-09-13 NOTE — Assessment & Plan Note (Signed)
H/o hyperglycemia.  Due for f/u labs.  She is working on diet.  Exercise is limited as below.

## 2019-09-13 NOTE — Assessment & Plan Note (Signed)
Colonoscopy: Up to date, completed 09/06/2017 Mammogram: declined, discussed with patient regarding pandemic. Bone Density: Up to date, completed 03/02/2016, declined 2020 given pandemic. Recommended yearly ophthalmology/optometry visit for glaucoma screening and checkup Recommended yearly dental visit for hygiene and checkup  Vaccinations: Influenza vaccine: Up to date, completed 07/11/2019 Pneumococcal vaccine: Completed series Tdap vaccine: Up to date, completed 10/25/2012 Shingles vaccine: d/w pt.     Advance directive- daughter Larene [place] designated if patient were incapacitated.

## 2019-09-13 NOTE — Assessment & Plan Note (Signed)
Advance directive-daughter Shannon designated if patient were incapacitated. 

## 2019-09-13 NOTE — Assessment & Plan Note (Signed)
No emergent symptoms at this point.  Discussed options.  She can use routine home exercise program for lower back strengthening and stretching.  She can use prednisone and gabapentin with routine cautions, if needed.  Specific steroid cautions given and also sedation caution regarding gabapentin.  She will update me as needed.  At this point she does not need to start either medication but I printed them off so she would have them in case.  She can update me as needed.  No need for imaging at this point. >25 minutes spent in face to face time with patient, >50% spent in counselling or coordination of care

## 2020-01-14 ENCOUNTER — Ambulatory Visit (INDEPENDENT_AMBULATORY_CARE_PROVIDER_SITE_OTHER): Payer: Medicare Other | Admitting: Family Medicine

## 2020-01-14 ENCOUNTER — Encounter: Payer: Self-pay | Admitting: Family Medicine

## 2020-01-14 ENCOUNTER — Other Ambulatory Visit: Payer: Self-pay

## 2020-01-14 VITALS — BP 122/70 | HR 80 | Temp 97.1°F | Ht 67.0 in | Wt 206.1 lb

## 2020-01-14 DIAGNOSIS — R739 Hyperglycemia, unspecified: Secondary | ICD-10-CM | POA: Diagnosis not present

## 2020-01-14 LAB — POCT GLYCOSYLATED HEMOGLOBIN (HGB A1C): Hemoglobin A1C: 5.8 % — AB (ref 4.0–5.6)

## 2020-01-14 MED ORDER — GLUCOSE BLOOD VI STRP: ORAL_STRIP | 3 refills | Status: DC

## 2020-01-14 NOTE — Patient Instructions (Signed)
Try to work on exercise in the meantime, continue working on your diet and recheck in about 3 months with A1c at the visit.  Take care.  Glad to see you.

## 2020-01-14 NOTE — Progress Notes (Signed)
This visit occurred during the SARS-CoV-2 public health emergency.  Safety protocols were in place, including screening questions prior to the visit, additional usage of staff PPE, and extensive cleaning of exam room while observing appropriate contact time as indicated for disinfecting solutions.  She had her covid vaccine.    She has been checking her sugar episodically.  Started checking in the last few months. Averaging ~120s, in spite of carb counting.    We talked about walking, diet and exercise.   Meds, vitals, and allergies reviewed.   ROS: Per HPI unless specifically indicated in ROS section   GEN: nad, alert and oriented HEENT: ncat NECK: supple w/o LA CV: rrr PULM: ctab, no inc wob ABD: soft, +bs EXT: no edema SKIN: Well-perfused.

## 2020-01-16 NOTE — Assessment & Plan Note (Signed)
Not diabetic by A1c.  Discussed options She can continue working on diet and recheck in about 3 months with A1c at the visit.  She agrees.

## 2020-07-25 DIAGNOSIS — Z23 Encounter for immunization: Secondary | ICD-10-CM | POA: Diagnosis not present

## 2020-10-26 ENCOUNTER — Encounter: Payer: Self-pay | Admitting: Family Medicine

## 2020-11-25 ENCOUNTER — Telehealth: Payer: Self-pay

## 2020-11-25 NOTE — Telephone Encounter (Signed)
This has been taken care of by Regina, LPN and she filled out necessary paperwork and faxed to health department.  

## 2020-11-25 NOTE — Telephone Encounter (Signed)
-----   Message from Joaquim Nam, MD sent at 10/29/2020  8:05 PM EST ----- This patient also tested positive for Covid.  Used a home test.  Cannot get reported to the health department?  She wanted to make sure it got tracked.   Clelia Croft

## 2021-01-26 ENCOUNTER — Encounter (HOSPITAL_COMMUNITY): Payer: Self-pay

## 2021-01-26 ENCOUNTER — Other Ambulatory Visit: Payer: Self-pay

## 2021-01-26 ENCOUNTER — Ambulatory Visit (HOSPITAL_COMMUNITY)
Admission: RE | Admit: 2021-01-26 | Discharge: 2021-01-26 | Disposition: A | Discharge status: Home / Self Care | Payer: Medicare Other | Source: Ambulatory Visit | Attending: Physician Assistant | Admitting: Physician Assistant

## 2021-01-26 VITALS — BP 121/81 | HR 72 | Temp 98.0°F | Resp 18

## 2021-01-26 DIAGNOSIS — J329 Chronic sinusitis, unspecified: Secondary | ICD-10-CM

## 2021-01-26 DIAGNOSIS — J40 Bronchitis, not specified as acute or chronic: Secondary | ICD-10-CM | POA: Diagnosis not present

## 2021-01-26 DIAGNOSIS — R059 Cough, unspecified: Secondary | ICD-10-CM

## 2021-01-26 MED ORDER — BENZONATATE 100 MG PO CAPS: 100.0000 mg | ORAL_CAPSULE | Freq: Three times a day (TID) | ORAL | 0 refills | Status: DC

## 2021-01-26 MED ORDER — AMOXICILLIN-POT CLAVULANATE 875-125 MG PO TABS: 1.0000 | ORAL_TABLET | Freq: Two times a day (BID) | ORAL | 0 refills | Status: DC

## 2021-01-26 NOTE — ED Triage Notes (Signed)
Pt presents with c/o cough, chest congestion with some mild sob for past 12 days, negative covid

## 2021-01-26 NOTE — ED Provider Notes (Signed)
[facility]    CSN: [id number] Arrival date & time: 01/26/21  1238      History   Chief Complaint Chief Complaint  Patient presents with  . Cough  . Nasal Congestion    HPI Theresa Eaton is a 70 y.o. female.   Theresa Eaton presents today with a 12 day history of cough and congestion. She reports associated fatigue, headache, drainage. Denies fever, shortness of breath, chest pain, nausea, vomiting. She has tried Robitussin without improvement of symptoms. She denies history of allergies or chronic lung condition. She denies any recent antibiotic use. She is up to date on flu and COVID vaccinations including COVID booster. She denies any known sick contact but has been many people. States symptoms have been worsening over the past few days prompting evaluation today.      Past Medical History:  Diagnosis Date  . Arthritis    bursitis in lt. and rt.  . Chicken pox   . Diabetes mellitus without complication (HCC)    history of no longer since 2015  . GERD (gastroesophageal reflux disease)   . Hyperlipidemia    in the past no longer has  . Osteopenia    started on fosamax 2013    Patient Active Problem List   Diagnosis Date Noted  . Sciatica 09/13/2019  . Healthcare maintenance 09/05/2018  . Sore throat 09/05/2018  . Travel advice encounter 07/01/2017  . Trochanteric bursitis 07/01/2017  . Advance care planning 09/18/2014  . Medicare annual wellness visit, subsequent 03/09/2013  . Hyperglycemia 11/07/2012  . HTN (hypertension) 11/02/2012  . Hyperlipidemia 11/02/2012  . Osteopenia 11/02/2012    Past Surgical History:  Procedure Laterality Date  . COLONOSCOPY  10/25/2005   Diablock/Normal    . Laser closure of greater saphenous vein, Left  2015  . TUBAL LIGATION      OB History   No obstetric history on file.      Home Medications    Prior to Admission medications   Medication Sig Start Date End Date Taking? Authorizing Provider   amoxicillin-clavulanate (AUGMENTIN) 875-125 MG tablet Take 1 tablet by mouth every 12 (twelve) hours. 01/26/21  Yes Breindy Meadow K, PA-C  benzonatate (TESSALON) 100 MG capsule Take 1 capsule (100 mg total) by mouth every 8 (eight) hours. 01/26/21  Yes Kaelen Brennan K, PA-C  glucose blood test strip Use daily to check sugar.  Dx R73.9.  One touch ultra test strips. 01/14/20   Joaquim Nam, MD  Multiple Vitamins-Minerals (CENTRUM SILVER ADULT 50+ PO) Take 1 tablet by mouth daily.    [provider]    Family History Family History  Problem Relation Age of Onset  . Hyperlipidemia Mother   . Dementia Mother   . Cancer Father        prostate  . Osteoporosis Father   . Cancer Sister        breast  . Diabetes Sister   . Breast cancer Sister   . Osteoporosis Brother   . Osteoporosis Maternal Aunt   . Diabetes Maternal Grandmother   . Heart disease Maternal Grandfather   . Heart disease Paternal Grandfather   . Colon cancer Neg Hx   . Colon polyps Neg Hx   . Esophageal cancer Neg Hx   . Stomach cancer Neg Hx   . Rectal cancer Neg Hx     Social History Social History   Tobacco Use  . Smoking status: Never Smoker  . Smokeless tobacco: Never Used  Vaping Use  . Vaping Use: Never used  Substance Use Topics  . Alcohol use: Yes    Alcohol/week: 4.0 standard drinks    Types: 4 Glasses of wine per week    Comment: 1 glass of wine with a meal 4x/wk  . Drug use: No     Allergies   Statins   Review of Systems Review of Systems  Constitutional: Positive for fatigue. Negative for activity change, appetite change and fever.  HENT: Positive for congestion, postnasal drip and sinus pressure. Negative for rhinorrhea, sneezing and sore throat.   Respiratory: Positive for cough. Negative for shortness of breath.   Cardiovascular: Negative for chest pain.  Gastrointestinal: Negative for abdominal pain, diarrhea, nausea and vomiting.  Musculoskeletal: Negative for arthralgias and  myalgias.  Neurological: Positive for headaches. Negative for dizziness and light-headedness.     Physical Exam Triage Vital Signs ED Triage Vitals  Enc Vitals Group     BP 01/26/21 1257 121/81     Pulse Rate 01/26/21 1257 72     Resp 01/26/21 1257 18     Temp 01/26/21 1257 98 F (36.7 C)     Temp src --      SpO2 01/26/21 1257 100 %     Weight --      Height --      Head Circumference --      Peak Flow --      Pain Score 01/26/21 1255 0     Pain Loc --      Pain Edu? --      Excl. in GC? --    No data found.  Updated Vital Signs BP 121/81   Pulse 72   Temp 98 F (36.7 C)   Resp 18   SpO2 100%   Visual Acuity Right Eye Distance:   Left Eye Distance:   Bilateral Distance:    Right Eye Near:   Left Eye Near:    Bilateral Near:     Physical Exam Vitals reviewed.  Constitutional:      General: She is awake. She is not in acute distress.    Appearance: Normal appearance. She is not ill-appearing.     Comments: Very pleasant female appears stated age in no acute distress.   HENT:     Head: Normocephalic and atraumatic.     Right Ear: Tympanic membrane, ear canal and external ear normal. Tympanic membrane is not erythematous or bulging.     Left Ear: Tympanic membrane, ear canal and external ear normal. Tympanic membrane is not erythematous or bulging.     Nose:     Right Sinus: Maxillary sinus tenderness and frontal sinus tenderness present.     Left Sinus: Maxillary sinus tenderness and frontal sinus tenderness present.     Mouth/Throat:     Pharynx: Uvula midline. No oropharyngeal exudate or posterior oropharyngeal erythema.     Comments: Drainage present in posterior oropharynx.  Cardiovascular:     Rate and Rhythm: Normal rate and regular rhythm.     Heart sounds: No murmur heard.   Pulmonary:     Effort: Pulmonary effort is normal.     Breath sounds: Normal breath sounds. No wheezing, rhonchi or rales.     Comments: Clear to auscultation bilaterally    Musculoskeletal:     Right lower leg: No edema.     Left lower leg: No edema.  Lymphadenopathy:     Head:     Right side of head: No submental,  submandibular or tonsillar adenopathy.     Left side of head: No submental, submandibular or tonsillar adenopathy.     Cervical: No cervical adenopathy.  Psychiatric:        Behavior: Behavior is cooperative.      UC Treatments / Results  Labs (all labs ordered are listed, but only abnormal results are displayed) Labs Reviewed - No data to display  EKG   Radiology No results found.  Procedures Procedures (including critical care time)  Medications Ordered in UC Medications - No data to display  Initial Impression / Assessment and Plan / UC Course  I have reviewed the triage vital signs and the nursing notes.  Pertinent labs & imaging results that were available during my care of the patient were reviewed by me and considered in my medical decision making (see chart for details).     No indication for COVID testing given patient has been symptomatic for several weeks. Vital signs and physical exam reassuring today so x-ray and labs deferred. Patient started on antibiotics given prolonged and worsening symptoms. She was prescribed tessalon to help manage cough. Recommended she continue with over the counter medications including Mucinex for symptom relief. Strict return precautions given to which patient expressed understanding.    Final Clinical Impressions(s) / UC Diagnoses   Final diagnoses:  Sinobronchitis  Cough     Discharge Instructions     Take antibiotic with food. You can use over the counter medicine such as Mucinex to help with symptoms. You should drink lots of water. If anything worsens please come back to be seen. If you do not get better with the medicine please come back.     ED Prescriptions    Medication Sig Dispense Auth. Provider   amoxicillin-clavulanate (AUGMENTIN) 875-125 MG tablet Take 1 tablet  by mouth every 12 (twelve) hours. 14 tablet Katura Eatherly K, PA-C   benzonatate (TESSALON) 100 MG capsule Take 1 capsule (100 mg total) by mouth every 8 (eight) hours. 21 capsule Waver Dibiasio K, PA-C     PDMP not reviewed this encounter.   Jeani Hawking, PA-C 01/26/21 1330

## 2021-01-26 NOTE — Discharge Instructions (Addendum)
Take antibiotic with food. You can use over the counter medicine such as Mucinex to help with symptoms. You should drink lots of water. If anything worsens please come back to be seen. If you do not get better with the medicine please come back.

## 2021-03-05 ENCOUNTER — Other Ambulatory Visit: Payer: Self-pay

## 2021-03-05 ENCOUNTER — Encounter: Payer: Self-pay | Admitting: Family Medicine

## 2021-03-05 ENCOUNTER — Ambulatory Visit: Payer: Medicare Other | Admitting: Family Medicine

## 2021-03-05 VITALS — BP 124/62 | HR 82 | Temp 97.3°F | Wt 211.3 lb

## 2021-03-05 DIAGNOSIS — R251 Tremor, unspecified: Secondary | ICD-10-CM | POA: Diagnosis not present

## 2021-03-05 LAB — CBC WITH DIFFERENTIAL/PLATELET
Basophils Absolute: 0 10*3/uL (ref 0.0–0.1)
Basophils Relative: 0.8 % (ref 0.0–3.0)
Eosinophils Absolute: 0.1 10*3/uL (ref 0.0–0.7)
Eosinophils Relative: 1.6 % (ref 0.0–5.0)
HCT: 39.7 % (ref 36.0–46.0)
Hemoglobin: 13.4 g/dL (ref 12.0–15.0)
Lymphocytes Relative: 41.8 % (ref 12.0–46.0)
Lymphs Abs: 2.1 10*3/uL (ref 0.7–4.0)
MCHC: 33.7 g/dL (ref 30.0–36.0)
MCV: 88.8 fl (ref 78.0–100.0)
Monocytes Absolute: 0.6 10*3/uL (ref 0.1–1.0)
Monocytes Relative: 11.2 % (ref 3.0–12.0)
Neutro Abs: 2.3 10*3/uL (ref 1.4–7.7)
Neutrophils Relative %: 44.6 % (ref 43.0–77.0)
Platelets: 255 10*3/uL (ref 150.0–400.0)
RBC: 4.47 Mil/uL (ref 3.87–5.11)
RDW: 13.1 % (ref 11.5–15.5)
WBC: 5 10*3/uL (ref 4.0–10.5)

## 2021-03-05 LAB — TSH: TSH: 2.09 u[IU]/mL (ref 0.35–4.50)

## 2021-03-05 LAB — COMPREHENSIVE METABOLIC PANEL
ALT: 15 U/L (ref 0–35)
AST: 19 U/L (ref 0–37)
Albumin: 4.1 g/dL (ref 3.5–5.2)
Alkaline Phosphatase: 63 U/L (ref 39–117)
BUN: 13 mg/dL (ref 6–23)
CO2: 33 mEq/L — ABNORMAL HIGH (ref 19–32)
Calcium: 9.6 mg/dL (ref 8.4–10.5)
Chloride: 104 mEq/L (ref 96–112)
Creatinine, Ser: 0.8 mg/dL (ref 0.40–1.20)
GFR: 73.74 mL/min (ref >60.00)
Glucose, Bld: 101 mg/dL — ABNORMAL HIGH (ref 70–99)
Potassium: 5 mEq/L (ref 3.5–5.1)
Sodium: 142 mEq/L (ref 135–145)
Total Bilirubin: 0.4 mg/dL (ref 0.2–1.2)
Total Protein: 6.7 g/dL (ref 6.0–8.3)

## 2021-03-05 NOTE — Patient Instructions (Signed)
Go to the lab on the way out.   If you have mychart we'll likely use that to update you.    Take care.  Glad to see you. Let me see about options after I see your labs.

## 2021-03-05 NOTE — Progress Notes (Signed)
This visit occurred during the SARS-CoV-2 public health emergency.  Safety protocols were in place, including screening questions prior to the visit, additional usage of staff PPE, and extensive cleaning of exam room while observing appropriate contact time as indicated for disinfecting solutions.  B hand tremor.  Going on for about 1 year.  Father had a tremor in R hand.  Handwriting is affected.  She is L handed.  No trouble with a fork.  No dysphagia.  No resting tremor.  Once had trouble putting on mascara.  She has noted L>R hand sx. She hasn't noted changes in her smile.  No gait changes.  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  No FH parkinson's.    She said some wax buildup in the right ear and wanted that checked.  Meds, vitals, and allergies reviewed.   ROS: Per HPI unless specifically indicated in ROS section   GEN: nad, alert and oriented HEENT: ncat, right canal with wax impaction that was not successfully removed with irrigation.  She tolerated the procedure but did not have good relief.  We talked about trying Debrox at home, followed by home irrigation. NECK: supple w/o LA CV: rrr.  PULM: ctab, no inc wob ABD: soft, +bs EXT: no edema SKIN: no acute rash Faint tremor holding a paper with either hand but no tremor on finger to nose testing.  No resting tremor. CN 2-12 wnl B, S/S wnl x4

## 2021-03-08 ENCOUNTER — Other Ambulatory Visit: Payer: Self-pay | Admitting: Family Medicine

## 2021-03-08 DIAGNOSIS — R251 Tremor, unspecified: Secondary | ICD-10-CM | POA: Insufficient documentation

## 2021-03-08 MED ORDER — PROPRANOLOL HCL 10 MG PO TABS: 10.0000 mg | ORAL_TABLET | Freq: Two times a day (BID) | ORAL | 3 refills | Status: DC | PRN

## 2021-03-08 NOTE — Assessment & Plan Note (Signed)
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  We can refer to neurology if needed.  If her labs are normal then it is likely reasonable to try a low-dose of a as needed beta-blocker.  See notes on labs.  Rationale discussed with patient.  She agrees with plan.  Okay for outpatient follow-up.  Separate issue but see above regarding cerumen impaction.

## 2021-04-15 ENCOUNTER — Ambulatory Visit (INDEPENDENT_AMBULATORY_CARE_PROVIDER_SITE_OTHER): Payer: Medicare Other

## 2021-04-15 ENCOUNTER — Other Ambulatory Visit: Payer: Self-pay

## 2021-04-15 DIAGNOSIS — Z0000 Encounter for general adult medical examination without abnormal findings: Secondary | ICD-10-CM | POA: Diagnosis not present

## 2021-04-15 NOTE — Patient Instructions (Signed)
[name] , Thank you for taking time to come for your Medicare Wellness Visit. I appreciate your ongoing commitment to your health goals. Please review the following plan we discussed and let me know if I can assist you in the future.   Screening recommendations/referrals: Colonoscopy: Up to date, completed 09/06/2017, due 08/2027 Mammogram: due, will discuss with provider  Bone Density: declined  Recommended yearly ophthalmology/optometry visit for glaucoma screening and checkup Recommended yearly dental visit for hygiene and checkup  Vaccinations: Influenza vaccine: Up to date, completed 07/25/2020, due 05/2021 Pneumococcal vaccine: Completed series Tdap vaccine: Up to date, completed 10/25/2012, due 10/2022 Shingles vaccine: due, check with your insurance regarding coverage if interested    Covid-19:Completed series  Advanced directives: copy in chart  Conditions/risks identified: hypertension, hyperlipidemia   Next appointment: Follow up in one year for your annual wellness visit    Preventive Care 65 Years and Older, Female Preventive care refers to lifestyle choices and visits with your health care provider that can promote health and wellness. What does preventive care include? A yearly physical exam. This is also called an annual well check. Dental exams once or twice a year. Routine eye exams. Ask your health care provider how often you should have your eyes checked. Personal lifestyle choices, including: Daily care of your teeth and gums. Regular physical activity. Eating a healthy diet. Avoiding tobacco and drug use. Limiting alcohol use. Practicing safe sex. Taking low-dose aspirin every day. Taking vitamin and mineral supplements as recommended by your health care provider. What happens during an annual well check? The services and screenings done by your health care provider during your annual well check will depend on your age, overall health, lifestyle risk  factors, and family history of disease. Counseling  Your health care provider may ask you questions about your: Alcohol use. Tobacco use. Drug use. Emotional well-being. Home and relationship well-being. Sexual activity. Eating habits. History of falls. Memory and ability to understand (cognition). Work and work Astronomer. Reproductive health. Screening  You may have the following tests or measurements: Height, weight, and BMI. Blood pressure. Lipid and cholesterol levels. These may be checked every 5 years, or more frequently if you are over 72 years old. Skin check. Lung cancer screening. You may have this screening every year starting at age 72 if you have a 30-pack-year history of smoking and currently smoke or have quit within the past 15 years. Fecal occult blood test (FOBT) of the stool. You may have this test every year starting at age 72. Flexible sigmoidoscopy or colonoscopy. You may have a sigmoidoscopy every 5 years or a colonoscopy every 10 years starting at age 72. Hepatitis C blood test. Hepatitis B blood test. Sexually transmitted disease (STD) testing. Diabetes screening. This is done by checking your blood sugar (glucose) after you have not eaten for a while (fasting). You may have this done every 1-3 years. Bone density scan. This is done to screen for osteoporosis. You may have this done starting at age 72. Mammogram. This may be done every 1-2 years. Talk to your health care provider about how often you should have regular mammograms. Talk with your health care provider about your test results, treatment options, and if necessary, the need for more tests. Vaccines  Your health care provider may recommend certain vaccines, such as: Influenza vaccine. This is recommended every year. Tetanus, diphtheria, and acellular pertussis (Tdap, Td) vaccine. You may need a Td booster every 10 years. Zoster vaccine. You may need  this after age 72. Pneumococcal 13-valent  conjugate (PCV13) vaccine. One dose is recommended after age 72. Pneumococcal polysaccharide (PPSV23) vaccine. One dose is recommended after age 72. Talk to your health care provider about which screenings and vaccines you need and how often you need them. This information is not intended to replace advice given to you by your health care provider. Make sure you discuss any questions you have with your health care provider. Document Released: 11/07/2015 Document Revised: 06/30/2016 Document Reviewed: 08/12/2015 Elsevier Interactive Patient Education  2017 [employer] Prevention in the Home Falls can cause injuries. They can happen to people of all ages. There are many things you can do to make your home safe and to help prevent falls. What can I do on the outside of my home? Regularly fix the edges of walkways and driveways and fix any cracks. Remove anything that might make you trip as you walk through a door, such as a raised step or threshold. Trim any bushes or trees on the path to your home. Use bright outdoor lighting. Clear any walking paths of anything that might make someone trip, such as rocks or tools. Regularly check to see if handrails are loose or broken. Make sure that both sides of any steps have handrails. Any raised decks and porches should have guardrails on the edges. Have any leaves, snow, or ice cleared regularly. Use sand or salt on walking paths during winter. Clean up any spills in your garage right away. This includes oil or grease spills. What can I do in the bathroom? Use night lights. Install grab bars by the toilet and in the tub and shower. Do not use towel bars as grab bars. Use non-skid mats or decals in the tub or shower. If you need to sit down in the shower, use a plastic, non-slip stool. Keep the floor dry. Clean up any water that spills on the floor as soon as it happens. Remove soap buildup in the tub or shower regularly. Attach bath mats  securely with double-sided non-slip rug tape. Do not have throw rugs and other things on the floor that can make you trip. What can I do in the bedroom? Use night lights. Make sure that you have a light by your bed that is easy to reach. Do not use any sheets or blankets that are too big for your bed. They should not hang down onto the floor. Have a firm chair that has side arms. You can use this for support while you get dressed. Do not have throw rugs and other things on the floor that can make you trip. What can I do in the kitchen? Clean up any spills right away. Avoid walking on wet floors. Keep items that you use a lot in easy-to-reach places. If you need to reach something above you, use a strong step stool that has a grab bar. Keep electrical cords out of the way. Do not use floor polish or wax that makes floors slippery. If you must use wax, use non-skid floor wax. Do not have throw rugs and other things on the floor that can make you trip. What can I do with my stairs? Do not leave any items on the stairs. Make sure that there are handrails on both sides of the stairs and use them. Fix handrails that are broken or loose. Make sure that handrails are as long as the stairways. Check any carpeting to make sure that it is firmly attached to  the stairs. Fix any carpet that is loose or worn. Avoid having throw rugs at the top or bottom of the stairs. If you do have throw rugs, attach them to the floor with carpet tape. Make sure that you have a light switch at the top of the stairs and the bottom of the stairs. If you do not have them, ask someone to add them for you. What else can I do to help prevent falls? Wear shoes that: Do not have high heels. Have rubber bottoms. Are comfortable and fit you well. Are closed at the toe. Do not wear sandals. If you use a stepladder: Make sure that it is fully opened. Do not climb a closed stepladder. Make sure that both sides of the stepladder  are locked into place. Ask someone to hold it for you, if possible. Clearly mark and make sure that you can see: Any grab bars or handrails. First and last steps. Where the edge of each step is. Use tools that help you move around (mobility aids) if they are needed. These include: Canes. Walkers. Scooters. Crutches. Turn on the lights when you go into a dark area. Replace any light bulbs as soon as they burn out. Set up your furniture so you have a clear path. Avoid moving your furniture around. If any of your floors are uneven, fix them. If there are any pets around you, be aware of where they are. Review your medicines with your doctor. Some medicines can make you feel dizzy. This can increase your chance of falling. Ask your doctor what other things that you can do to help prevent falls. This information is not intended to replace advice given to you by your health care provider. Make sure you discuss any questions you have with your health care provider. Document Released: 08/07/2009 Document Revised: 03/18/2016 Document Reviewed: 11/15/2014 Elsevier Interactive Patient Education  2017 [employer].

## 2021-04-15 NOTE — Progress Notes (Addendum)
Subjective:   Theresa Eaton is a 70 y.o. female who presents for Medicare Annual (Subsequent) preventive examination.  Review of Systems: N/A      I connected with the patient today by telephone and verified that I am speaking with the correct person using two identifiers. Location patient: home Location nurse: work Persons participating in the telephone visit: patient, nurse.   I discussed the limitations, risks, security and privacy concerns of performing an evaluation and management service by telephone and the availability of in person appointments. I also discussed with the patient that there may be a patient responsible charge related to this service. The patient expressed understanding and verbally consented to this telephonic visit.        Cardiac Risk Factors include: advanced age (>70men, >71 women);hypertension;Other (see comment), Risk factor comments: hyperlipidemia     Objective:    Today's Vitals   There is no height or weight on file to calculate BMI.  Advanced Directives 04/15/2021 09/10/2019 09/04/2018 08/23/2017 06/30/2017  Does Patient Have a Medical Advance Directive? Yes Yes Yes Yes Yes  Type of Estate agent of [place];Living will Healthcare Power of [place];Living will Healthcare Power of [place];Living will Healthcare Power of [place];Living will Healthcare Power of [place];Living will  Copy of Healthcare Power of Attorney in Chart? Yes - validated most recent copy scanned in chart (See row information) No - copy requested No - copy requested - No - copy requested    Current Medications (verified) Outpatient Encounter Medications as of 04/15/2021  Medication Sig   Multiple Vitamins-Minerals (CENTRUM SILVER ADULT 50+ PO) Take 1 tablet by mouth daily.   naproxen sodium (ALEVE) 220 MG tablet Take 220 mg by mouth daily as needed.   propranolol (INDERAL) 10 MG tablet Take 1 tablet (10 mg total) by mouth 2 (two) times daily as needed  (for tremor).   No facility-administered encounter medications on file as of 04/15/2021.    Allergies (verified) Statins   History: Past Medical History:  Diagnosis Date   Arthritis    bursitis in lt. and rt.   Chicken pox    Diabetes mellitus without complication (HCC)    history of no longer since 2015   GERD (gastroesophageal reflux disease)    Hyperlipidemia    in the past no longer has   Osteopenia    started on fosamax 2013   Past Surgical History:  Procedure Laterality Date   COLONOSCOPY  10/25/2005   [place]/Normal     Laser closure of greater saphenous vein, Left  2015   TUBAL LIGATION     Family History  Problem Relation Age of Onset   Hyperlipidemia Mother    Dementia Mother    Cancer Father        prostate   Osteoporosis Father    Cancer Sister        breast   Diabetes Sister    Breast cancer Sister    Osteoporosis Brother    Osteoporosis Maternal Aunt    Diabetes Maternal Grandmother    Heart disease Maternal Grandfather    Heart disease Paternal Grandfather    Colon cancer Neg Hx    Colon polyps Neg Hx    Esophageal cancer Neg Hx    Stomach cancer Neg Hx    Rectal cancer Neg Hx    Social History   Socioeconomic History   Marital status: Married    Spouse name: Not on file   Number of children: Not on file  Years of education: Not on file   Highest education level: Not on file  Occupational History   Not on file  Tobacco Use   Smoking status: Never   Smokeless tobacco: Never  Vaping Use   Vaping Use: Never used  Substance and Sexual Activity   Alcohol use: Yes    Alcohol/week: 4.0 standard drinks    Types: 4 Glasses of wine per week    Comment: 1 glass of wine with a meal 4x/wk   Drug use: No   Sexual activity: Not on file  Other Topics Concern   Not on file  Social History Narrative   MDIV, Manfred Shirts priest   Married 1973   3 kids- her daughter is a family medicine MD (med school at [employer]).   Social Determinants of Health    Financial Resource Strain: Low Risk    Difficulty of Paying Living Expenses: Not hard at all  Food Insecurity: No Food Insecurity   Worried About Programme researcher, broadcasting/film/video in the Last Year: Never true   Ran Out of Food in the Last Year: Never true  Transportation Needs: No Transportation Needs   Lack of Transportation (Medical): No   Lack of Transportation (Non-Medical): No  Physical Activity: Sufficiently Active   Days of Exercise per Week: 7 days   Minutes of Exercise per Session: 30 min  Stress: No Stress Concern Present   Feeling of Stress : Not at all  Social Connections: Not on file    Tobacco Counseling Counseling given: Not Answered   Clinical Intake:  Pre-visit preparation completed: Yes  Pain : 0-10 Pain Type: Chronic pain Pain Location: Hip Pain Orientation: Right, Left Pain Descriptors / Indicators: Aching Pain Onset: More than a month ago Pain Frequency: Intermittent     Nutritional Risks: None Diabetes: No  How often do you need to have someone help you when you read instructions, pamphlets, or other written materials from your doctor or pharmacy?: 1 - Never  Diabetic: no Nutrition Risk Assessment:  Has the patient had any N/V/D within the last 2 months?  No  Does the patient have any non-healing wounds?  No  Has the patient had any unintentional weight loss or weight gain?  No   Diabetes:  Is the patient diabetic?  No  If diabetic, was a CBG obtained today?   N/A Did the patient bring in their glucometer from home?   N/A How often do you monitor your CBG's? N/A.   Financial Strains and Diabetes Management:  Are you having any financial strains with the device, your supplies or your medication?  N/A .  Does the patient want to be seen by Chronic Care Management for management of their diabetes?   N/A Would the patient like to be referred to a Nutritionist or for Diabetic Management?   N/A   Interpreter Needed?: No  Information entered by ::  CJohnson, LPN   Activities of Daily Living In your present state of health, do you have any difficulty performing the following activities: 04/15/2021  Hearing? N  Vision? N  Difficulty concentrating or making decisions? N  Walking or climbing stairs? N  Dressing or bathing? N  Doing errands, shopping? N  Preparing Food and eating ? N  Using the Toilet? N  In the past six months, have you accidently leaked urine? N  Do you have problems with loss of bowel control? N  Managing your Medications? N  Managing your Finances? N  Housekeeping or managing your  Housekeeping? N  Some recent data might be hidden    Patient Care Team: Joaquim Namuncan, Graham S, MD as PCP - General (Family Medicine)  Indicate any recent Medical Services you may have received from other than [facility] providers in the past year (date may be approximate).     Assessment:   This is a routine wellness examination for [place].  Hearing/Vision screen Vision Screening - Comments:: Patient gets annual eye exams   Dietary issues and exercise activities discussed: Current Exercise Habits: Home exercise routine, Type of exercise: walking, Time (Minutes): 30, Frequency (Times/Week): 7, Weekly Exercise (Minutes/Week): 210, Intensity: Moderate, Exercise limited by: None identified   Goals Addressed             This Visit's Progress    Patient Stated       04/15/2021, I will continue to walk daily for about 30 minutes and do stretching exercises.         Depression Screen PHQ 2/9 Scores 04/15/2021 03/05/2021 09/10/2019 09/04/2018 06/30/2017 01/27/2016 09/16/2014  PHQ - 2 Score 0 0 0 0 0 0 0  PHQ- 9 Score 0 0 0 0 0 - -    Fall Risk Fall Risk  04/15/2021 09/10/2019 09/12/2018 09/04/2018 06/30/2017  Falls in the past year? 0 0 0 0 No  Comment - - Emmi Telephone Survey: data to providers prior to load - -  Number falls in past yr: 0 0 - - -  Injury with Fall? 0 0 - - -  Risk for fall due to : No Fall Risks - - - -  Follow up  Falls evaluation completed;Falls prevention discussed Falls evaluation completed;Falls prevention discussed - - -    FALL RISK PREVENTION PERTAINING TO THE HOME:  Any stairs in or around the home? Yes  If so, are there any without handrails? No  Home free of loose throw rugs in walkways, pet beds, electrical cords, etc? Yes  Adequate lighting in your home to reduce risk of falls? Yes   ASSISTIVE DEVICES UTILIZED TO PREVENT FALLS:  Life alert? No  Use of a cane, walker or w/c? No  Grab bars in the bathroom? Yes  Shower chair or bench in shower? No  Elevated toilet seat or a handicapped toilet? No   TIMED UP AND GO:  Was the test performed?  N/A video/telephone visit .   Cognitive Function: MMSE - Mini Mental State Exam 04/15/2021 09/10/2019 09/04/2018 06/30/2017  Not completed: Refused - - -  Orientation to time - 5 5 5   Orientation to Place - 5 5 5   Registration - 3 3 3   Attention/ Calculation - 5 0 0  Recall - 3 3 3   Language- name 2 objects - - 0 0  Language- repeat - 1 1 1   Language- follow 3 step command - - 3 3  Language- read & follow direction - - 0 0  Write a sentence - - 0 0  Copy design - - 0 0  Total score - - 20 20  Mini Cog  Mini-Cog screen was not completed. Patient wanted to skip. Maximum score is 22. A value of 0 denotes this part of the MMSE was not completed or the patient failed this part of the Mini-Cog screening.       Immunizations Immunization History  Administered Date(s) Administered   Fluad Quad(high Dose 65+) 07/11/2019   Hep A / Hep B 07/10/2018, 08/15/2018   Influenza, High Dose Seasonal PF 07/03/2018   Influenza,inj,Quad PF,6+ Mos  08/23/2013, 09/16/2014   Influenza-Unspecified 07/25/2016, 07/03/2018   PFIZER(Purple Top)SARS-COV-2 Vaccination 12/06/2019, 12/31/2019, 07/25/2020, 02/23/2021   Pneumococcal Conjugate-13 09/16/2014   Pneumococcal Polysaccharide-23 01/27/2016   Td 10/25/2012   Typhoid Live 06/30/2017   Zoster, Live  08/27/2003    TDAP status: Up to date  Flu Vaccine status: Up to date  Pneumococcal vaccine status: Up to date  Covid-19 vaccine status: Completed vaccines  Qualifies for Shingles Vaccine? Yes   Zostavax completed Yes   Shingrix Completed?: No.    Education has been provided regarding the importance of this vaccine. Patient has been advised to call insurance company to determine out of pocket expense if they have not yet received this vaccine. Advised may also receive vaccine at local pharmacy or Health Dept. Verbalized acceptance and understanding.  Screening Tests Health Maintenance  Topic Date Due   Zoster Vaccines- Shingrix (1 of 2) Never done   MAMMOGRAM  11/25/2019   INFLUENZA VACCINE  05/25/2021   TETANUS/TDAP  10/25/2022   COLONOSCOPY (Pts 45-18yrs Insurance coverage will need to be confirmed)  09/07/2027   DEXA SCAN  Completed   COVID-19 Vaccine  Completed   Hepatitis C Screening  Completed   PNA vac Low Risk Adult  Completed   HPV VACCINES  Aged Out    Health Maintenance  Health Maintenance Due  Topic Date Due   Zoster Vaccines- Shingrix (1 of 2) Never done   MAMMOGRAM  11/25/2019    Colorectal cancer screening: Type of screening: Colonoscopy. Completed 09/06/2017. Repeat every 10 years  Mammogram status: due, will discuss with provider  Bone Density status: declined  Lung Cancer Screening: (Low Dose CT Chest recommended if Age 89-80 years, 30 pack-year currently smoking OR have quit w/in 15 years.) does not qualify.  Additional Screening:  Hepatitis C Screening: does qualify; Completed 01/27/2016  Vision Screening: Recommended annual ophthalmology exams for early detection of glaucoma and other disorders of the eye. Is the patient up to date with their annual eye exam?  Yes  Who is the provider or what is the name of the office in which the patient attends annual eye exams? [facility] [place], [place] If pt is not established with a provider, would they  like to be referred to a provider to establish care? No .   Dental Screening: Recommended annual dental exams for proper oral hygiene  Community Resource Referral / Chronic Care Management: CRR required this visit?  No   CCM required this visit?  No      Plan:     I have personally reviewed and noted the following in the patient's chart:   Medical and social history Use of alcohol, tobacco or illicit drugs  Current medications and supplements including opioid prescriptions.  Functional ability and status Nutritional status Physical activity Advanced directives List of other physicians Hospitalizations, surgeries, and ER visits in previous 12 months Vitals Screenings to include cognitive, depression, and falls Referrals and appointments  In addition, I have reviewed and discussed with patient certain preventive protocols, quality metrics, and best practice recommendations. A written personalized care plan for preventive services as well as general preventive health recommendations were provided to patient.   Due to this being a video/telephonic visit, the after visit summary with patients personalized plan was offered to patient via office or my-chart. Patient preferred to pick up at office at next visit or via mychart.   Janalyn Shy, LPN   7/68/1157

## 2021-04-15 NOTE — Progress Notes (Signed)
PCP notes:  Health Maintenance: Shingrix- due Mammogram- due Dexa- declined    Abnormal Screenings: none   Patient concerns: Bilateral hip pain    Nurse concerns: none   Next PCP appt.: none

## 2021-07-31 ENCOUNTER — Other Ambulatory Visit: Payer: Self-pay | Admitting: Family Medicine

## 2021-08-18 ENCOUNTER — Encounter: Payer: Self-pay | Admitting: Family Medicine

## 2021-08-19 ENCOUNTER — Other Ambulatory Visit: Payer: Self-pay | Admitting: Family Medicine

## 2021-08-19 DIAGNOSIS — K219 Gastro-esophageal reflux disease without esophagitis: Secondary | ICD-10-CM | POA: Insufficient documentation

## 2021-08-19 MED ORDER — OMEPRAZOLE 20 MG PO CPDR: 20.0000 mg | DELAYED_RELEASE_CAPSULE | Freq: Every day | ORAL | 3 refills | Status: DC

## 2021-12-10 ENCOUNTER — Other Ambulatory Visit: Payer: Self-pay | Admitting: Family Medicine

## 2021-12-10 NOTE — Telephone Encounter (Signed)
Patient due for appt; please schedule

## 2021-12-11 NOTE — Telephone Encounter (Signed)
Pt stated that she will call back to schedule when she is in town

## 2022-01-10 ENCOUNTER — Other Ambulatory Visit: Payer: Self-pay | Admitting: Family Medicine

## 2022-01-14 ENCOUNTER — Encounter: Payer: Self-pay | Admitting: Family Medicine

## 2022-01-14 ENCOUNTER — Other Ambulatory Visit: Payer: Self-pay

## 2022-01-14 ENCOUNTER — Telehealth (INDEPENDENT_AMBULATORY_CARE_PROVIDER_SITE_OTHER): Payer: Medicare Other | Admitting: Family Medicine

## 2022-01-14 DIAGNOSIS — R251 Tremor, unspecified: Secondary | ICD-10-CM

## 2022-01-14 MED ORDER — PROPRANOLOL HCL 10 MG PO TABS: 10.0000 mg | ORAL_TABLET | Freq: Two times a day (BID) | ORAL | 3 refills | Status: DC | PRN

## 2022-01-14 NOTE — Progress Notes (Signed)
Virtual visit completed through WebEx or similar program ?Patient location: home  ?Provider location: Adult nurse at [facility], office  ?Participants: Patient and me (unless stated otherwise below) ? ?Pandemic considerations d/w pt.  ? ?Limitations and rationale for visit method d/w patient.  Patient agreed to proceed.  ? ?CC: tremor.   ? ?HPI: going on for about 2 years.  Father had h/o tremor.  Has been on propranolol but it isn't helping as much now.  Write L handed. She plays viola R handed.  Tremor was worse in L hand initially but now inc'd in the R hand too.   ? ?Propranolol was helpful initially.  She hasn't checked BP recently.  [facility] can check [facility] the [facility].  She isn't lightheaded on standing.  No foot or jaw tremor.  No dysphagia.  Normal smile.  No resting tremor.  She has action tremor.   ? ?She is taking propranolol BID, scheduled.   ? ?Meds and allergies reviewed.  ? ?ROS: Per HPI unless specifically indicated in ROS section  ? ?NAD ?Speech wnl ? ?A/P: ? ?Tremor.  D/w pt about options, increasing propranolol, primidone, neurology referral. ?She'll check BP/pulse and update me.   It makes sense not to increase propranolol until we have blood pressure and pulse data.  If BP >130/>80 and pulse is above 65, then okay to try propranolol 10mg  TID and update me.  If she doesn't meet those criteria, then she'll update me.  Consider referral vs primidone at that point.   ? ?Noted that mother had h/o SZ but patient has no hx thereof.   ?

## 2022-01-16 ENCOUNTER — Other Ambulatory Visit: Payer: Self-pay

## 2022-01-16 ENCOUNTER — Emergency Department (HOSPITAL_BASED_OUTPATIENT_CLINIC_OR_DEPARTMENT_OTHER)
Admission: EM | Admit: 2022-01-16 | Discharge: 2022-01-16 | Disposition: A | Discharge status: Home / Self Care | Payer: Medicare Other | Attending: Emergency Medicine | Admitting: Emergency Medicine

## 2022-01-16 ENCOUNTER — Emergency Department (HOSPITAL_BASED_OUTPATIENT_CLINIC_OR_DEPARTMENT_OTHER): Payer: Medicare Other

## 2022-01-16 ENCOUNTER — Encounter (HOSPITAL_BASED_OUTPATIENT_CLINIC_OR_DEPARTMENT_OTHER): Payer: Self-pay

## 2022-01-16 DIAGNOSIS — Y9301 Activity, walking, marching and hiking: Secondary | ICD-10-CM | POA: Insufficient documentation

## 2022-01-16 DIAGNOSIS — S82839A Other fracture of upper and lower end of unspecified fibula, initial encounter for closed fracture: Secondary | ICD-10-CM

## 2022-01-16 DIAGNOSIS — S82831A Other fracture of upper and lower end of right fibula, initial encounter for closed fracture: Secondary | ICD-10-CM | POA: Insufficient documentation

## 2022-01-16 DIAGNOSIS — M79671 Pain in right foot: Secondary | ICD-10-CM | POA: Diagnosis not present

## 2022-01-16 DIAGNOSIS — S92354A Nondisplaced fracture of fifth metatarsal bone, right foot, initial encounter for closed fracture: Secondary | ICD-10-CM | POA: Diagnosis not present

## 2022-01-16 DIAGNOSIS — W010XXA Fall on same level from slipping, tripping and stumbling without subsequent striking against object, initial encounter: Secondary | ICD-10-CM | POA: Diagnosis not present

## 2022-01-16 DIAGNOSIS — S99921A Unspecified injury of right foot, initial encounter: Secondary | ICD-10-CM | POA: Diagnosis present

## 2022-01-16 MED ORDER — BACITRACIN-NEOMYCIN-POLYMYXIN 400-5-5000 EX OINT: TOPICAL_OINTMENT | Freq: Once | CUTANEOUS | Status: DC

## 2022-01-16 MED ORDER — IBUPROFEN 800 MG PO TABS
800.0000 mg | ORAL_TABLET | Freq: Three times a day (TID) | ORAL | 0 refills | Status: DC | PRN
Start: 2022-01-16 — End: 2022-05-14

## 2022-01-16 MED ORDER — HYDROCODONE-ACETAMINOPHEN 5-325 MG PO TABS: 1.0000 | ORAL_TABLET | Freq: Four times a day (QID) | ORAL | 0 refills | Status: DC | PRN

## 2022-01-16 NOTE — ED Notes (Signed)
Crutch education completed, patient demonstrated competence and verbalized that she will be obtaining a scooter once home.  ?

## 2022-01-16 NOTE — ED Triage Notes (Signed)
Pt arrives with c/o right ankle pain after falling while walking. Pt has abrasion on left arm and knee. Pt denies LOC.  ?

## 2022-01-16 NOTE — ED Notes (Signed)
Patient's wound on left elbow cleaned with wound cleaner and dry gauze, non adhesive patch placed over elbow, wrapped in 3 inch kerlex and 3 inch coban.  Patient tolerated procedure well. Bacitracin ointment applied before non adhesive.  Verbal order from DO Wallace Cullens, as we do not have neosporin in stock.  ?

## 2022-01-16 NOTE — ED Provider Notes (Signed)
?[facility] EMERGENC[facility] DEPT ?Provider Note ? ? ?CSN: [id number] ?Arrival date & time: 01/16/22  1825 ? ?  ? ?History ? ?Chief Complaint  ?Patient presents with  ? Foot Pain  ? ? ?Paul HalfBarbara J Johnstone is a 71 y.o. female. ? ?Patient is a 71 year old female presenting for foot pain after fall.  Patient states she was walking outside and she tripped and fell.  Denies head trauma.  Denies LOC or blood thinner use.  Dates that she injured her right foot.  Is any swelling, bruising, sensation or motor deficits. ? ?The history is provided by the patient and the spouse. No language interpreter was used.  ?Ankle Pain ?Associated symptoms: no back pain and no fever   ? ?  ? ?Home Medications ?Prior to Admission medications   ?Medication Sig Start Date End Date Taking? Authorizing Provider  ?HYDROcodone-acetaminophen (NORCO) 5-325 MG tablet Take 1 tablet by mouth every 6 (six) hours as needed for moderate pain. 01/16/22  Yes Edwin DadaGray, Safa Derner P, DO  ?ibuprofen (ADVIL) 800 MG tablet Take 1 tablet (800 mg total) by mouth every 8 (eight) hours as needed for mild pain. 01/16/22  Yes Franne FortsGray, Eldra Word P, DO  ?Multiple Vitamins-Minerals (CENTRUM SILVER ADULT 50+ PO) Take 1 tablet by mouth daily.    [provider]  ?omeprazole (PRILOSEC) 20 MG capsule Take 1 capsule (20 mg total) by mouth daily. 08/19/21   Joaquim Namuncan, Graham S, MD  ?propranolol (INDERAL) 10 MG tablet Take 1 tablet (10 mg total) by mouth 2 (two) times daily as needed (for tremor). 01/14/22   Joaquim Namuncan, Graham S, MD  ?   ? ?Allergies    ?Statins   ? ?Review of Systems   ?Review of Systems  ?Constitutional:  Negative for chills and fever.  ?HENT:  Negative for ear pain and sore throat.   ?Eyes:  Negative for pain and visual disturbance.  ?Respiratory:  Negative for cough and shortness of breath.   ?Cardiovascular:  Negative for chest pain and palpitations.  ?Gastrointestinal:  Negative for abdominal pain and vomiting.  ?Genitourinary:  Negative for dysuria and hematuria.   ?Musculoskeletal:  Negative for arthralgias and back pain.  ?Skin:  Positive for wound. Negative for color change and rash.  ?Neurological:  Negative for seizures and syncope.  ?All other systems reviewed and are negative. ? ?Physical Exam ?Updated Vital Signs ?BP (!) 169/86 (BP Location: Right Arm)   Pulse 63   Temp 98.6 ?F (37 ?C) (Oral)   Resp 18   Ht 5\' 7"  (1.702 m)   Wt 99.8 kg   SpO2 97%   BMI 34.46 kg/m?  ?Physical Exam ?Vitals and nursing note reviewed.  ?Constitutional:   ?   General: She is not in acute distress. ?   Appearance: She is well-developed.  ?HENT:  ?   Head: Normocephalic and atraumatic.  ?Eyes:  ?   Conjunctiva/sclera: Conjunctivae normal.  ?Cardiovascular:  ?   Rate and Rhythm: Normal rate and regular rhythm.  ?   Pulses:     ?     Dorsalis pedis pulses are 2+ on the right side and 2+ on the left side.  ?   Heart sounds: No murmur heard. ?Pulmonary:  ?   Effort: Pulmonary effort is normal. No respiratory distress.  ?   Breath sounds: Normal breath sounds.  ?Abdominal:  ?   Palpations: Abdomen is soft.  ?   Tenderness: There is no abdominal tenderness.  ?Musculoskeletal:     ?   General:  No swelling.  ?   Cervical back: Neck supple.  ?   Right ankle: Normal.  ?   Left ankle: Normal.  ?   Right foot: Tenderness present. No swelling or deformity. Normal pulse.  ?   Left foot: Normal. Normal pulse.  ?     Feet: ? ?Skin: ?   General: Skin is warm and dry.  ?   Capillary Refill: Capillary refill takes less than 2 seconds.  ?Neurological:  ?   General: No focal deficit present.  ?   Mental Status: She is alert and oriented to person, place, and time.  ?   GCS: GCS eye subscore is 4. GCS verbal subscore is 5. GCS motor subscore is 6.  ?   Sensory: Sensation is intact.  ?   Motor: Motor function is intact.  ?Psychiatric:     ?   Mood and Affect: Mood normal.  ? ? ?ED Results / Procedures / Treatments   ?Labs ?(all labs ordered are listed, but only abnormal results are displayed) ?Labs Reviewed  - No data to display ? ?EKG ?None ? ?Radiology ?DG Ankle Complete Right ? ?Result Date: 01/16/2022 ?CLINICAL DATA:  Right ankle pain following fall, initial encounter EXAM: RIGHT ANKLE - COMPLETE 3+ VIEW COMPARISON:  None. FINDINGS: Undisplaced avulsion is noted from the tip of the distal fibula. There is a fracture at the base of the fifth metatarsal best seen on the lateral projection. No other fracture is seen. No soft tissue abnormality is noted. IMPRESSION: Avulsion fracture from the distal fibula as well as a mildly displaced fracture at the base of the fifth metatarsal. Electronically Signed   By: Alcide Clever M.D.   On: 01/16/2022 19:27  ? ?DG Foot Complete Right ? ?Result Date: 01/16/2022 ?CLINICAL DATA:  Fall. EXAM: RIGHT FOOT COMPLETE - 3+ VIEW COMPARISON:  Right ankle radiograph dated 01/16/2022. FINDINGS: Nondisplaced fracture of the base of the fifth metatarsal. Fracture of the tip of the lateral malleolus seen on the ankle radiograph is poorly visualized on this foot radiograph. There is no dislocation. The bones are osteopenic. The soft tissues are grossly unremarkable. IMPRESSION: Nondisplaced fracture of the base of the fifth metatarsal. No dislocation. Electronically Signed   By: Elgie Collard M.D.   On: 01/16/2022 19:40   ? ?Procedures ?Procedures  ? ? ?Medications Ordered in ED ?Medications - No data to display ? ?ED Course/ Medical Decision Making/ A&P ?  ?                        ?Medical Decision Making ?Amount and/or Complexity of Data Reviewed ?Radiology: ordered. ? ?Risk ?OTC drugs. ?Prescription drug management. ? ? ?22:73 AM ?71 year old female presenting for foot pain after fall.  Patient is alert and oriented x3, no acute distress, afebrile, stable vital signs.  Physical exam demonstrates tenderness to palpation over fourth metatarsal on right foot.  No swelling, ecchymosis, or lacerations. Patient neurovascularly intact.  Xray demonstrates: fifth metatarsal fracture consistent with a  jones fracture. Posterior short splint applied. Pt also has small avulsion fracture of distal fibula. Patient remains neurovascularly intact after splint. Crutches given with recommendations for scooter for ease of use from medical supply store or on line. Patient in no distress and overall condition improved here in the ED. Detailed discussions were had with the patient regarding current findings, and need for close f/u with orthopedic surgery. The patient has been instructed to return immediately if the symptoms worsen  in any way for re-evaluation. Patient verbalized understanding and is in agreement with current care plan. All questions answered prior to discharge. ? ? ? ? ? ? ? ? ?Final Clinical Impression(s) / ED Diagnoses ?Final diagnoses:  ?Closed nondisplaced fracture of fifth metatarsal bone of right foot, initial encounter  ?Avulsion fracture of distal fibula  ? ? ?Rx / DC Orders ?ED Discharge Orders   ? ?      Ordered  ?  ibuprofen (ADVIL) 800 MG tablet  Every 8 hours PRN       ? 01/16/22 2056  ?  HYDROcodone-acetaminophen (NORCO) 5-325 MG tablet  Every 6 hours PRN       ? 01/16/22 2056  ? ?  ?  ? ?  ? ? ?  ?Franne Forts, DO ?01/17/22 1101 ? ?

## 2022-01-16 NOTE — Discharge Instructions (Addendum)
Please rest, ice, elevation, and wear your splint until you see orthopedic surgery.  Narcotics and Motrin 800 has been sent to your pharmacy.  ?

## 2022-01-17 ENCOUNTER — Telehealth: Payer: Self-pay | Admitting: Family Medicine

## 2022-01-17 ENCOUNTER — Ambulatory Visit (HOSPITAL_COMMUNITY): Payer: Self-pay

## 2022-01-17 NOTE — Telephone Encounter (Signed)
Please get update on patient after ER eval/fall.  Thanks.  ?

## 2022-01-17 NOTE — Assessment & Plan Note (Signed)
Tremor.  D/w pt about options, increasing propranolol, primidone, neurology referral. ?She'll check BP/pulse and update me.   It makes sense not to increase propranolol until we have blood pressure and pulse data.  If BP >130/>80 and pulse is above 65, then okay to try propranolol 10mg  TID and update me.  If she doesn't meet those criteria, then she'll update me.  Consider referral vs primidone at that point.   ? ?Noted that mother had h/o SZ but patient has no hx thereof.   ?

## 2022-01-18 MED ORDER — PRIMIDONE 50 MG PO TABS: 25.0000 mg | ORAL_TABLET | Freq: Every day | ORAL | 1 refills | Status: DC

## 2022-01-18 NOTE — Telephone Encounter (Signed)
Patient sent message via mychart

## 2022-04-16 ENCOUNTER — Telehealth: Payer: Self-pay | Admitting: Family Medicine

## 2022-04-23 ENCOUNTER — Encounter: Payer: Self-pay | Admitting: Family Medicine

## 2022-04-26 ENCOUNTER — Other Ambulatory Visit: Payer: Self-pay | Admitting: Family Medicine

## 2022-04-26 MED ORDER — PRIMIDONE 50 MG PO TABS: 50.0000 mg | ORAL_TABLET | Freq: Every day | ORAL | 1 refills | Status: DC

## 2022-05-14 ENCOUNTER — Telehealth (INDEPENDENT_AMBULATORY_CARE_PROVIDER_SITE_OTHER): Payer: Medicare Other | Admitting: Nurse Practitioner

## 2022-05-14 ENCOUNTER — Other Ambulatory Visit: Payer: Self-pay | Admitting: Family Medicine

## 2022-05-14 ENCOUNTER — Encounter: Payer: Self-pay | Admitting: Family Medicine

## 2022-05-14 VITALS — Temp 97.3°F

## 2022-05-14 DIAGNOSIS — U071 COVID-19: Secondary | ICD-10-CM | POA: Insufficient documentation

## 2022-05-14 MED ORDER — MOLNUPIRAVIR EUA 200MG CAPSULE
4.0000 | ORAL_CAPSULE | Freq: Two times a day (BID) | ORAL | 0 refills | Status: AC
Start: 2022-05-14 — End: 2022-05-19

## 2022-05-14 NOTE — Progress Notes (Signed)
Patient ID: Theresa Eaton, female    DOB: [birth year], 71 y.o.   MRN: [id number]  Virtual visit completed through [employer], a video enabled telemedicine application. Due to national recommendations of social distancing due to COVID-19, a virtual visit is felt to be most appropriate for this patient at this time. Reviewed limitations, risks, security and privacy concerns of performing a virtual visit and the availability of in person appointments. I also reviewed that there may be a patient responsible charge related to this service. The patient agreed to proceed.   Patient location: home Provider location: [place] at [facility], office Persons participating in this virtual visit: patient, provider   If any vitals were documented, they were collected by patient at home unless specified below.    Temp (!) 97.3 F (36.3 C) Comment: per patient   CC: Covid 19 Subjective:   HPI: Theresa Eaton is a 71 y.o. female presenting on 05/14/2022 for Covid Positive (On 05/14/22, sx started on 05/12/22-generalized muscle weakness, some SOB, nasal congestion.)  Symptoms started on 05/12/2022 Patient did at home covid test that was positive on 05/14/2022 States that she went on a cruise Covid vaccines and boosters She has been using tylenol and nasal decogonestant with miminlal relief     Relevant past medical, surgical, family and social history reviewed and updated as indicated. Interim medical history since our last visit reviewed. Allergies and medications reviewed and updated. Outpatient Medications Prior to Visit  Medication Sig Dispense Refill   Multiple Vitamins-Minerals (CENTRUM SILVER ADULT 50+ PO) Take 1 tablet by mouth daily.     omeprazole (PRILOSEC) 20 MG capsule Take 1 capsule (20 mg total) by mouth daily. 90 capsule 3   primidone (MYSOLINE) 50 MG tablet Take 50 mg by mouth daily.     propranolol (INDERAL) 10 MG tablet Take 10 mg by mouth 2 (two) times daily.     primidone  (MYSOLINE) 50 MG tablet TAKE 0.5 TABLETS BY MOUTH AT BEDTIME. (Patient taking differently: Take 50 mg by mouth daily.) 15 tablet 3   propranolol (INDERAL) 10 MG tablet Take 1 tablet (10 mg total) by mouth 2 (two) times daily as needed (for tremor). (Patient taking differently: Take 10 mg by mouth 2 (two) times daily.) 180 tablet 3   HYDROcodone-acetaminophen (NORCO) 5-325 MG tablet Take 1 tablet by mouth every 6 (six) hours as needed for moderate pain. 12 tablet 0   ibuprofen (ADVIL) 800 MG tablet Take 1 tablet (800 mg total) by mouth every 8 (eight) hours as needed for mild pain. 21 tablet 0   No facility-administered medications prior to visit.     Per HPI unless specifically indicated in ROS section below Review of Systems  Constitutional:  Positive for fatigue. Negative for chills and fever.       Appetite decreased  Fluid increase  HENT:  Positive for congestion, postnasal drip and sore throat. Negative for ear discharge and ear pain.   Respiratory:  Positive for cough and shortness of breath (DOE).   Gastrointestinal:  Negative for abdominal pain, diarrhea, nausea and vomiting.  Musculoskeletal:  Positive for arthralgias.  Neurological:  Negative for headaches.   Objective:  Temp (!) 97.3 F (36.3 C) Comment: per patient  Wt Readings from Last 3 Encounters:  01/16/22 220 lb (99.8 kg)  01/14/22 219 lb (99.3 kg)  03/05/21 211 lb 4.8 oz (95.8 kg)       Physical exam: Gen: alert, NAD, not ill appearing Pulm: speaks in complete  sentences without increased work of breathing Psych: normal mood, normal thought content      Results for orders placed or performed in visit on 03/05/21  Comprehensive metabolic panel  Result Value Ref Range   Sodium 142 135 - 145 mEq/L   Potassium 5.0 3.5 - 5.1 mEq/L   Chloride 104 96 - 112 mEq/L   CO2 33 (H) 19 - 32 mEq/L   Glucose, Bld 101 (H) 70 - 99 mg/dL   BUN 13 6 - 23 mg/dL   Creatinine, Ser 1.61 0.40 - 1.20 mg/dL   Total Bilirubin 0.4  0.2 - 1.2 mg/dL   Alkaline Phosphatase 63 39 - 117 U/L   AST 19 0 - 37 U/L   ALT 15 0 - 35 U/L   Total Protein 6.7 6.0 - 8.3 g/dL   Albumin 4.1 3.5 - 5.2 g/dL   GFR 09.60 >45.40 mL/min   Calcium 9.6 8.4 - 10.5 mg/dL  CBC with Differential/Platelet  Result Value Ref Range   WBC 5.0 4.0 - 10.5 K/uL   RBC 4.47 3.87 - 5.11 Mil/uL   Hemoglobin 13.4 12.0 - 15.0 g/dL   HCT 98.1 19.1 - 47.8 %   MCV 88.8 78.0 - 100.0 fl   MCHC 33.7 30.0 - 36.0 g/dL   RDW 29.5 62.1 - 30.8 %   Platelets 255.0 150.0 - 400.0 K/uL   Neutrophils Relative % 44.6 43.0 - 77.0 %   Lymphocytes Relative 41.8 12.0 - 46.0 %   Monocytes Relative 11.2 3.0 - 12.0 %   Eosinophils Relative 1.6 0.0 - 5.0 %   Basophils Relative 0.8 0.0 - 3.0 %   Neutro Abs 2.3 1.4 - 7.7 K/uL   Lymphs Abs 2.1 0.7 - 4.0 K/uL   Monocytes Absolute 0.6 0.1 - 1.0 K/uL   Eosinophils Absolute 0.1 0.0 - 0.7 K/uL   Basophils Absolute 0.0 0.0 - 0.1 K/uL  TSH  Result Value Ref Range   TSH 2.09 0.35 - 4.50 uIU/mL   Assessment & Plan:   Problem List Items Addressed This Visit   None Visit Diagnoses     COVID-19    -  Primary   Relevant Medications   molnupiravir EUA (LAGEVRIO) 200 mg CAPS capsule        Meds ordered this encounter  Medications   molnupiravir EUA (LAGEVRIO) 200 mg CAPS capsule    Sig: Take 4 capsules (800 mg total) by mouth 2 (two) times daily for 5 days.    Dispense:  40 capsule    Refill:  0    Order Specific Question:   Supervising Provider    Answer:   TOWER, MARNE A [[id number]]   No orders of the defined types were placed in this encounter.   I discussed the assessment and treatment plan with the patient. The patient was provided an opportunity to ask questions and all were answered. The patient agreed with the plan and demonstrated an understanding of the instructions. The patient was advised to call back or seek an in-person evaluation if the symptoms worsen or if the condition fails to improve as  anticipated.  Follow up plan: No follow-ups on file.  Audria Nine, NP

## 2022-05-14 NOTE — Telephone Encounter (Signed)
Will evaluate in office

## 2022-05-14 NOTE — Telephone Encounter (Signed)
Spoke to patient by telephone and a mychart appointment scheduled for today 05/14/22 with Audria Nine NP at 3:40. Patient was given ER precautions and she verbalized understanding.

## 2022-05-14 NOTE — Assessment & Plan Note (Signed)
Patient has been vaccinated against COVID-19.  Recently on a cruise, likely place of exposure.  Did discuss antiviral treatments that they are EUA only.  Given renal function on file several years old we will defer use of Paxlovid and prefer molnupiravir.  Did discuss common side effects of medication.  Discussed self-isolation/quarantine in regards to [facility] recommendations and guidelines.  Patient given signs and symptoms when to seek urgent emergent healthcare.

## 2022-07-29 ENCOUNTER — Telehealth: Payer: Self-pay | Admitting: Family Medicine

## 2022-07-29 DIAGNOSIS — M8580 Other specified disorders of bone density and structure, unspecified site: Secondary | ICD-10-CM

## 2022-07-29 DIAGNOSIS — R251 Tremor, unspecified: Secondary | ICD-10-CM

## 2022-07-29 DIAGNOSIS — E785 Hyperlipidemia, unspecified: Secondary | ICD-10-CM

## 2022-07-29 NOTE — Telephone Encounter (Signed)
Patient called in stating she is trying to get her labs done for her CPE at the [facility] location but they need the orders to be put in. Thank you!

## 2022-07-30 ENCOUNTER — Other Ambulatory Visit (INDEPENDENT_AMBULATORY_CARE_PROVIDER_SITE_OTHER): Payer: Medicare Other

## 2022-07-30 DIAGNOSIS — M8580 Other specified disorders of bone density and structure, unspecified site: Secondary | ICD-10-CM | POA: Diagnosis not present

## 2022-07-30 DIAGNOSIS — R251 Tremor, unspecified: Secondary | ICD-10-CM | POA: Diagnosis not present

## 2022-07-30 DIAGNOSIS — E785 Hyperlipidemia, unspecified: Secondary | ICD-10-CM | POA: Diagnosis not present

## 2022-07-30 LAB — COMPREHENSIVE METABOLIC PANEL
ALT: 10 U/L (ref 0–35)
AST: 17 U/L (ref 0–37)
Albumin: 4 g/dL (ref 3.5–5.2)
Alkaline Phosphatase: 75 U/L (ref 39–117)
BUN: 18 mg/dL (ref 6–23)
CO2: 27 mEq/L (ref 19–32)
Calcium: 8.9 mg/dL (ref 8.4–10.5)
Chloride: 102 mEq/L (ref 96–112)
Creatinine, Ser: 0.8 mg/dL (ref 0.40–1.20)
GFR: 73.02 mL/min (ref >60.00)
Glucose, Bld: 113 mg/dL — ABNORMAL HIGH (ref 70–99)
Potassium: 4.5 mEq/L (ref 3.5–5.1)
Sodium: 137 mEq/L (ref 135–145)
Total Bilirubin: 0.5 mg/dL (ref 0.2–1.2)
Total Protein: 7 g/dL (ref 6.0–8.3)

## 2022-07-30 LAB — CBC WITH DIFFERENTIAL/PLATELET
Basophils Absolute: 0 10*3/uL (ref 0.0–0.1)
Basophils Relative: 0.9 % (ref 0.0–3.0)
Eosinophils Absolute: 0.1 10*3/uL (ref 0.0–0.7)
Eosinophils Relative: 2.1 % (ref 0.0–5.0)
HCT: 34.9 % — ABNORMAL LOW (ref 36.0–46.0)
Hemoglobin: 11.5 g/dL — ABNORMAL LOW (ref 12.0–15.0)
Lymphocytes Relative: 42.1 % (ref 12.0–46.0)
Lymphs Abs: 2.1 10*3/uL (ref 0.7–4.0)
MCHC: 33 g/dL (ref 30.0–36.0)
MCV: 86.3 fl (ref 78.0–100.0)
Monocytes Absolute: 0.6 10*3/uL (ref 0.1–1.0)
Monocytes Relative: 11.2 % (ref 3.0–12.0)
Neutro Abs: 2.2 10*3/uL (ref 1.4–7.7)
Neutrophils Relative %: 43.7 % (ref 43.0–77.0)
Platelets: 265 10*3/uL (ref 150.0–400.0)
RBC: 4.04 Mil/uL (ref 3.87–5.11)
RDW: 13.9 % (ref 11.5–15.5)
WBC: 5.1 10*3/uL (ref 4.0–10.5)

## 2022-07-30 LAB — LIPID PANEL
Cholesterol: 222 mg/dL — ABNORMAL HIGH (ref 0–200)
HDL: 50.8 mg/dL (ref >39.00)
LDL Cholesterol: 146 mg/dL — ABNORMAL HIGH (ref 0–99)
NonHDL: 171.16
Total CHOL/HDL Ratio: 4
Triglycerides: 128 mg/dL (ref 0.0–149.0)
VLDL: 25.6 mg/dL (ref 0.0–40.0)

## 2022-07-30 LAB — VITAMIN D 25 HYDROXY (VIT D DEFICIENCY, FRACTURES): VITD: 31.89 ng/mL (ref 30.00–100.00)

## 2022-07-30 LAB — TSH: TSH: 4.18 u[IU]/mL (ref 0.35–5.50)

## 2022-07-30 NOTE — Telephone Encounter (Signed)
I put in the orders.  Thanks.  ?

## 2022-07-30 NOTE — Addendum Note (Signed)
Addended by: Tonia Ghent on: 07/30/2022 07:12 AM   Modules accepted: Orders

## 2022-07-30 NOTE — Telephone Encounter (Signed)
Patient notified by telephone that orders have been put in. Patient stated that she had called the office earlier today and was advised of this. Patient stated that she went this morning and had the lab work done.

## 2022-08-06 ENCOUNTER — Encounter: Payer: Self-pay | Admitting: Family Medicine

## 2022-08-06 ENCOUNTER — Ambulatory Visit (INDEPENDENT_AMBULATORY_CARE_PROVIDER_SITE_OTHER): Payer: Medicare Other | Admitting: Family Medicine

## 2022-08-06 VITALS — BP 120/64 | HR 80 | Temp 97.2°F | Ht 67.0 in | Wt 213.0 lb

## 2022-08-06 DIAGNOSIS — Z0000 Encounter for general adult medical examination without abnormal findings: Secondary | ICD-10-CM | POA: Diagnosis not present

## 2022-08-06 DIAGNOSIS — R739 Hyperglycemia, unspecified: Secondary | ICD-10-CM | POA: Diagnosis not present

## 2022-08-06 DIAGNOSIS — Z7189 Other specified counseling: Secondary | ICD-10-CM

## 2022-08-06 DIAGNOSIS — Z780 Asymptomatic menopausal state: Secondary | ICD-10-CM

## 2022-08-06 DIAGNOSIS — Z1239 Encounter for other screening for malignant neoplasm of breast: Secondary | ICD-10-CM

## 2022-08-06 DIAGNOSIS — R251 Tremor, unspecified: Secondary | ICD-10-CM

## 2022-08-06 DIAGNOSIS — D649 Anemia, unspecified: Secondary | ICD-10-CM | POA: Diagnosis not present

## 2022-08-06 MED ORDER — OMEPRAZOLE 20 MG PO CPDR: 20.0000 mg | DELAYED_RELEASE_CAPSULE | Freq: Every day | ORAL | 3 refills | Status: DC

## 2022-08-06 NOTE — Progress Notes (Unsigned)
I have personally reviewed the Medicare Annual Wellness questionnaire and have noted 1. The patient's medical and social history 2. Their use of alcohol, tobacco or illicit drugs 3. Their current medications and supplements 4. The patient's functional ability including ADL's, fall risks, home safety risks and hearing or visual             impairment. 5. Diet and physical activities 6. Evidence for depression or mood disorders  The patients weight, height, BMI have been recorded in the chart and visual acuity is per eye clinic.  I have made referrals, counseling and provided education to the patient based review of the above and I have provided the pt with a written personalized care plan for preventive services.  Provider list updated- see scanned forms.  Routine anticipatory guidance given to patient.  See health maintenance. The possibility exists that previously documented standard health maintenance information may have been brought forward from a previous encounter into this note.  If needed, that same information has been updated to reflect the current situation based on today's encounter.    Flu Shingles PNA Tetanus Colon  Breast cancer screening Prostate cancer screening Advance directive Cognitive function addressed- see scanned forms- and if abnormal then additional documentation follows.   In addition to [facility] Wellness, follow up visit for the below conditions:  Mild anemia.  H/o colonoscopy.  No black or bloody stools.    Glucose elevated.  Recheck A1c pending.  She is working on diet and exercise.    Tremor.  No ADE on meds but lack of effect.  She was going well until this summer.  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  We talked about seeing neurology.  Referral placed.   PMH and SH reviewed  Meds, vitals, and allergies reviewed.   ROS: Per HPI.  Unless specifically indicated otherwise in HPI, the patient denies:  General:  fever. Eyes: acute vision changes ENT: sore throat Cardiovascular: chest pain Respiratory: SOB GI: vomiting GU: dysuria Musculoskeletal: acute back pain Derm: acute rash Neuro: acute motor dysfunction Psych: worsening mood Endocrine: polydipsia Heme: bleeding Allergy: hayfever  GEN: nad, alert and oriented HEENT: mucous membranes moist NECK: supple w/o LA CV: rrr. PULM: ctab, no inc wob ABD: soft, +bs EXT: no edema SKIN: no acute rash No tremor on exam.

## 2022-08-06 NOTE — Patient Instructions (Signed)
You can call for a mammogram at the [facility] [place] [phone]  Go to the lab on the way out.   If you have mychart we'll likely use that to update you.    Take care.  Glad to see you.

## 2022-08-07 LAB — IRON: Iron: 66 ug/dL (ref 45–160)

## 2022-08-07 LAB — HEMOGLOBIN A1C
Hgb A1c MFr Bld: 6.4 % of total Hgb — ABNORMAL HIGH (ref <5.7)
Mean Plasma Glucose: 137 mg/dL
eAG (mmol/L): 7.6 mmol/L

## 2022-08-07 LAB — FERRITIN: Ferritin: 10 ng/mL — ABNORMAL LOW (ref 16–288)

## 2022-08-07 LAB — VITAMIN B12: Vitamin B-12: 551 pg/mL (ref 200–1100)

## 2022-08-08 ENCOUNTER — Telehealth: Payer: Self-pay | Admitting: Family Medicine

## 2022-08-08 DIAGNOSIS — D649 Anemia, unspecified: Secondary | ICD-10-CM

## 2022-08-08 MED ORDER — IRON (FERROUS SULFATE) 325 (65 FE) MG PO TABS: 325.0000 mg | ORAL_TABLET | Freq: Every day | ORAL | Status: AC

## 2022-08-08 NOTE — Assessment & Plan Note (Signed)
Advance directive-daughter Larene Beach designated if patient were incapacitated.

## 2022-08-08 NOTE — Telephone Encounter (Signed)
See result note.   I put in the referral to GI.  If she can get scheduled in the next few weeks then let me know.  I also think it makes sense with the patient to start taking iron 325 mg daily.  She can get that over-the-counter.  If she has constipation or trouble taking it then let me know.  Thanks.

## 2022-08-08 NOTE — Assessment & Plan Note (Signed)
See notes on follow-up labs. 

## 2022-08-08 NOTE — Assessment & Plan Note (Signed)
Flu 2023 Shingles previously done PNA previously done Tetanus 2014 COVID-vaccine previously done Colonoscopy 2018 Breast cancer screening ordered 2023, discussed with patient.  See after visit summary. DXA ordered 2023, discussed with patient.  See after visit summary. Advance directive-daughter Larene Beach designated if patient were incapacitated. Cognitive function addressed- see scanned forms- and if abnormal then additional documentation follows.

## 2022-08-08 NOTE — Assessment & Plan Note (Signed)
No ADE on meds but lack of effect recently.  She was doing well until this summer.  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  We talked about seeing neurology.  Referral placed.  Continue beta-blocker and primidone.

## 2022-08-09 ENCOUNTER — Encounter: Payer: Self-pay | Admitting: Family Medicine

## 2022-08-09 NOTE — Telephone Encounter (Signed)
Patient notified of lab results and verbalized understanding. Patient is fine with seeing GI and will await call to schedule appt. Advised to call back if she does not get an appt within the next few weeks. Advised patient to start OTC iron and patient is okay to do as well.

## 2022-08-10 ENCOUNTER — Other Ambulatory Visit: Payer: Self-pay | Admitting: Family Medicine

## 2022-08-10 DIAGNOSIS — Z780 Asymptomatic menopausal state: Secondary | ICD-10-CM

## 2022-08-11 ENCOUNTER — Encounter: Payer: Self-pay | Admitting: Neurology

## 2022-08-11 ENCOUNTER — Ambulatory Visit: Payer: Medicare Other | Admitting: Neurology

## 2022-08-11 VITALS — BP 136/82 | HR 60 | Ht 67.0 in | Wt 214.4 lb

## 2022-08-11 DIAGNOSIS — G250 Essential tremor: Secondary | ICD-10-CM

## 2022-08-11 DIAGNOSIS — R0683 Snoring: Secondary | ICD-10-CM

## 2022-08-11 DIAGNOSIS — E669 Obesity, unspecified: Secondary | ICD-10-CM

## 2022-08-11 DIAGNOSIS — R351 Nocturia: Secondary | ICD-10-CM | POA: Diagnosis not present

## 2022-08-11 DIAGNOSIS — M7989 Other specified soft tissue disorders: Secondary | ICD-10-CM

## 2022-08-11 DIAGNOSIS — E66811 Obesity, class 1: Secondary | ICD-10-CM

## 2022-08-11 DIAGNOSIS — Z9189 Other specified personal risk factors, not elsewhere classified: Secondary | ICD-10-CM

## 2022-08-11 MED ORDER — PRIMIDONE 50 MG PO TABS: 50.0000 mg | ORAL_TABLET | Freq: Two times a day (BID) | ORAL | 3 refills | Status: DC

## 2022-08-11 NOTE — Progress Notes (Signed)
Subjective:    Patient ID: Theresa Eaton is a 71 y.o. female.  HPI    Huston Foley, MD, PhD [facility] [address] [address] [place], [place] [address]  Dear Dr. Para March,  I saw your patient, Theresa Eaton, upon your kind request in my neurologic clinic today for initial consultation of her tremors.  The patient is unaccompanied today.  As you know, Theresa Eaton is a 71 year old female with an underlying medical history of reflux disease, hyperlipidemia, osteopenia, diabetes, arthritis, edema of both lower extremities, and obesity, who reports a bilateral upper extremity tremor for the past 18 mo.  I reviewed your office note from 08/06/2022.  She has been on Mysoline 50 mg strength and also takes propranolol 10 mg twice daily.  She has been on these medications for the past several months, for started on low-dose propranolol twice daily.  She then started Mysoline 25 mg at night and then increased it to the current dose of 50 mg at night.  She tolerates the medication, denies any sleepiness from it.  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  Her father had hand tremors.  She has 2 sisters, neither 1 with tremors, her 3 children have no tremors.  She has limited her caffeine, generally drinks mostly decaf coffee.  She drinks alcohol in the form of wine, 1 glass about 3 days out of the week.  She has noticed a connection to stress exacerbating her tremors.  She has been sleeping fairly well.  She does have a history of snoring.  She does have sleep disruption from nocturia about 2-3 times, depending on how bad her leg swelling is.  She has bilateral leg edema.  She is a non-smoker.  She tries to hydrate well with water. She had recent blood work through your office on 08/06/2022 and I reviewed the results: Vitamin B-12 was 551, ferritin low at 10,  A1c was 6.4.  On 07/30/2022 her TSH was 4.18, vitamin D borderline at 31.9, total cholesterol 222, triglycerides 128, HDL 50.8, LDL elevated at 462.  Her Past Medical History Is Significant For: Past Medical History:  Diagnosis Date   Arthritis    bursitis in lt. and rt.   Chicken pox    Diabetes mellitus without complication (HCC)    history of no longer since 2015   GERD (gastroesophageal reflux disease)    Hyperlipidemia    in the past no longer has   Osteopenia    started on fosamax 2013   Tremor     Her Past Surgical History Is Significant For: Past Surgical History:  Procedure Laterality Date   COLONOSCOPY  10/25/2005   [place]/Normal     Laser closure of greater saphenous vein, Left  2015   TUBAL LIGATION      Her Family History Is Significant For: Family History  Problem Relation Age of Onset   Hyperlipidemia Mother    Dementia Mother    Cancer Father        prostate   Osteoporosis Father    Tremor Father    Cancer Sister        breast   Diabetes Sister    Breast cancer Sister    Osteoporosis Brother    Osteoporosis Maternal Aunt    Diabetes Maternal Grandmother    Heart disease Maternal Grandfather  Heart disease Paternal Grandfather    Colon cancer Neg Hx    Colon polyps Neg Hx    Esophageal cancer Neg Hx    Stomach cancer Neg Hx    Rectal cancer Neg Hx    Parkinson's disease Neg Hx     Her Social History Is Significant For: Social History   Socioeconomic History   Marital status: Married    Spouse name: Not on file   Number of children: Not on file   Years of education: Not on file   Highest education level: Not on file  Occupational History   Not on file  Tobacco Use   Smoking status: Never   Smokeless tobacco: Never  Vaping Use   Vaping Use: Never used  Substance and Sexual Activity   Alcohol use: Yes    Alcohol/week: 4.0 standard drinks of alcohol    Types: 4 Glasses of wine per week    Comment: 1 glass of wine with a meal 4x/wk    Drug use: No   Sexual activity: Not on file  Other Topics Concern   Not on file  Social History Narrative   MDIV, Manfred Shirts priest   Married 1973   3 kids- her daughter is a family medicine MD (med school at [employer]).   Social Determinants of Health   Financial Resource Strain: Low Risk  (04/15/2021)   Overall Financial Resource Strain (CARDIA)    Difficulty of Paying Living Expenses: Not hard at all  Food Insecurity: No Food Insecurity (04/15/2021)   Hunger Vital Sign    Worried About Running Out of Food in the Last Year: Never true    Ran Out of Food in the Last Year: Never true  Transportation Needs: No Transportation Needs (04/15/2021)   PRAPARE - Administrator, Civil Service (Medical): No    Lack of Transportation (Non-Medical): No  Physical Activity: Sufficiently Active (04/15/2021)   Exercise Vital Sign    Days of Exercise per Week: 7 days    Minutes of Exercise per Session: 30 min  Stress: No Stress Concern Present (04/15/2021)   [employer] of Occupational Health - Occupational Stress Questionnaire    Feeling of Stress : Not at all  Social Connections: Not on file    Her Allergies Are:  Allergies  Allergen Reactions   Statins     Leg weakness  :   Her Current Medications Are:  Outpatient Encounter Medications as of 08/11/2022  Medication Sig   Iron, Ferrous Sulfate, 325 (65 Fe) MG TABS Take 325 mg by mouth daily.   Multiple Vitamins-Minerals (CENTRUM SILVER ADULT 50+ PO) Take 1 tablet by mouth daily.   omeprazole (PRILOSEC) 20 MG capsule Take 1 capsule (20 mg total) by mouth daily.   primidone (MYSOLINE) 50 MG tablet Take 50 mg by mouth daily.   propranolol (INDERAL) 10 MG tablet Take 10 mg by mouth 2 (two) times daily.   No facility-administered encounter medications on file as of 08/11/2022.  :   Review of Systems:  Out of a complete 14 point review of systems, all are reviewed and negative with the exception of these symptoms as listed  below:  Review of Systems  Neurological:        Pt here for tremors consult  Pt states she has tremors in  both hands left has increased tremors  Pt states she was playing a viola but cant now because of tremors     Objective:  Neurological Exam  Physical  Exam Physical Examination:   Vitals:   08/11/22 1439  BP: 136/82  Pulse: 60    General Examination: The patient is a very pleasant 71 y.o. female in no acute distress. She appears well-developed and well-nourished and well groomed.   HEENT: Normocephalic, atraumatic, pupils are equal, round and reactive to light, extraocular tracking is good without limitation to gaze excursion or nystagmus noted. Hearing is grossly intact. Face is symmetric with normal facial animation. Speech is clear with no dysarthria noted. There is no hypophonia. There is no lip, neck/head, jaw or voice tremor. Neck is supple with full range of passive and active motion. There are no carotid bruits on auscultation. Oropharynx exam reveals: mild mouth dryness, adequate dental hygiene and mild airway crowding, due to small airway entry and redundant soft palate.  Tongue protrudes centrally and palate elevates symmetrically.  Chest: Clear to auscultation without wheezing, rhonchi or crackles noted.  Heart: S1+S2+0, regular and normal without murmurs, rubs or gallops noted.   Abdomen: Soft, non-tender and non-distended.  Extremities: There is 1-2+ edema in both lower extremities, left worse than right.    Skin: Warm and dry without trophic changes noted.   Musculoskeletal: exam reveals no obvious joint deformities.   Neurologically:  Mental status: The patient is awake, alert and oriented in all 4 spheres. Her immediate and remote memory, attention, language skills and fund of knowledge are appropriate. There is no evidence of aphasia, agnosia, apraxia or anomia. Speech is clear with normal prosody and enunciation. Thought process is linear. Mood is normal and  affect is normal.  Cranial nerves II - XII are as described above under HEENT exam.  Motor exam: Normal bulk, strength and tone is noted.  There is no resting tremor.  She has a very minimal postural and action tremor in both upper extremities.  She has no intention tremor, no lower extremity tremor.   On 08/11/2022: On Archimedes spiral drawing she has minimal trembling with left hand, minimal trembling with the right hand, handwriting with the left hand is legible, mildly tremulous, on the smaller side.   Fine motor skills and coordination: Normal finger taps, hand movements and rapid alternating patting in both upper extremities, normal foot taps, slightly difficulty with the left foot, likely due to swelling around the ankle.    Cerebellar testing: No dysmetria or intention tremor. There is no truncal or gait ataxia.  Sensory exam: intact to light touch in the upper and lower extremities.  Gait, station and balance: She stands without difficulty, posture is age-appropriate, she walks with a very mild limp on the left, no walking aids, no shuffling, has preserved arm swing.  Assessment and Plan:  In summary, Theresa Eaton is a very pleasant 71 y.o.-year old female with an underlying medical history of reflux disease, hyperlipidemia, osteopenia, diabetes, arthritis, edema of both lower extremities, and obesity, who presents for evaluation of her hand tremors of approximately 18 months duration.  The patient has been on low-dose propranolol 10 mg twice daily for the past several months and also currently on primidone 50 mg at night, with good tolerance.  She initially had improvement of her tremor but felt that it did become worse over the past few months.  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  Her father had hand tremors as well.  No evidence of parkinsonism and she is reassured in that regard.  We talked about tremor triggers quite a bit today, she does have some sleep  disruption and given her obesity, narrow airway, lower extremity swelling, nocturia reported, she may be at risk for obstructive sleep apnea.  She is advised to continue to try to hydrate well and limit her caffeine, continue with the propranolol 10 mg twice daily.  I would shy away from increasing this at this time as she already has a low normal pulse rate of 60.  She would be at risk of bradycardia.  She is advised to increase her primidone at this time gradually to 50 mg twice daily in 2 increments, she will increase it to 25 mg in the morning and 50 mg at night for the next 2 weeks and then increase it to 50 mg twice daily thereafter.   She is sleep apnea per se is a risk for tremors but could affect her tremors if she has sleep disruption from sleep apnea and the hope is that if she were treated for sleep apnea that her tremor may improve as a secondary phenomenon.  She has seen a connection between her stress level and her tremors.  She is encouraged to think about coming in for sleep study or considering a sleep test, she reports sleeping well and declines a sleep test at this time.  She is encouraged to bounce this off of the unit as well.  She is advised to follow-up in this clinic to see one of our nurse practitioners routinely in 3 months for a recheck on her tremors.  I have adjusted her primidone prescription.  She can continue with propranolol low-dose at the current dose and timings.   Thank you very much for allowing me to participate in the care of this nice patient. If I can be of any further assistance to you please do not hesitate to call me at [phone].  Sincerely,   Star Age, MD, PhD

## 2022-08-11 NOTE — Patient Instructions (Signed)
You have mild tremor of both hands, likely essential tremor.  I do not see any signs or symptoms of parkinson's like disease or what we call parkinsonism.  For your tremor, I would not recommend any new medications at this time.   I would recommend not increasing the propranolol for now as you have a low normal heart rate at 60 and increasing the betablocker may lower your heart rate to below 60/min (which we call bradycardia).  For now, I recommend you increase the primidone to 1/2 pill in AM and continue with 1 pill in the evening for 2 weeks, then increase to 1 pill twice daily after that.   Please remember, that any kind of tremor may be exacerbated by anxiety, anger, nervousness, excitement, dehydration, sleep deprivation, thyroid dysfunction, by caffeine, and low blood sugar values or blood sugar fluctuations. Some medications can exacerbate tremors, this includes certain asthma or COPD medications and certain antidepressants.   You may be at risk for obstructive sleep apnea. I recommend a sleep study. If you decide to pursue this let us know. We can do a sleep study through our office.

## 2022-08-19 ENCOUNTER — Ambulatory Visit
Admission: RE | Admit: 2022-08-19 | Discharge: 2022-08-19 | Disposition: A | Discharge status: Home / Self Care | Payer: Medicare Other | Source: Ambulatory Visit | Attending: Family Medicine | Admitting: Family Medicine

## 2022-08-19 DIAGNOSIS — Z780 Asymptomatic menopausal state: Secondary | ICD-10-CM

## 2022-08-23 ENCOUNTER — Other Ambulatory Visit: Payer: Self-pay | Admitting: Family Medicine

## 2022-08-23 MED ORDER — VITAMIN D3 50 MCG (2000 UT) PO CAPS: 2000.0000 [IU] | ORAL_CAPSULE | Freq: Every day | ORAL | Status: AC

## 2022-09-09 ENCOUNTER — Ambulatory Visit: Payer: Medicare Other | Admitting: Neurology

## 2022-09-27 ENCOUNTER — Ambulatory Visit: Payer: Medicare Other

## 2022-09-29 ENCOUNTER — Other Ambulatory Visit (INDEPENDENT_AMBULATORY_CARE_PROVIDER_SITE_OTHER): Payer: Medicare Other

## 2022-09-29 ENCOUNTER — Encounter: Payer: Self-pay | Admitting: Internal Medicine

## 2022-09-29 ENCOUNTER — Ambulatory Visit: Payer: Medicare Other | Admitting: Internal Medicine

## 2022-09-29 VITALS — BP 102/60 | HR 63 | Ht 67.0 in | Wt 215.0 lb

## 2022-09-29 DIAGNOSIS — D509 Iron deficiency anemia, unspecified: Secondary | ICD-10-CM

## 2022-09-29 DIAGNOSIS — K219 Gastro-esophageal reflux disease without esophagitis: Secondary | ICD-10-CM

## 2022-09-29 LAB — IBC + FERRITIN
Ferritin: 15.3 ng/mL (ref 10.0–291.0)
Iron: 68 ug/dL (ref 42–145)
Saturation Ratios: 22 % (ref 20.0–50.0)
TIBC: 309.4 ug/dL (ref 250.0–450.0)
Transferrin: 221 mg/dL (ref 212.0–360.0)

## 2022-09-29 LAB — CBC WITH DIFFERENTIAL/PLATELET
Basophils Absolute: 0.1 10*3/uL (ref 0.0–0.1)
Basophils Relative: 1.3 % (ref 0.0–3.0)
Eosinophils Absolute: 0.3 10*3/uL (ref 0.0–0.7)
Eosinophils Relative: 6.8 % — ABNORMAL HIGH (ref 0.0–5.0)
HCT: 35.7 % — ABNORMAL LOW (ref 36.0–46.0)
Hemoglobin: 11.9 g/dL — ABNORMAL LOW (ref 12.0–15.0)
Lymphocytes Relative: 32.5 % (ref 12.0–46.0)
Lymphs Abs: 1.6 10*3/uL (ref 0.7–4.0)
MCHC: 33.4 g/dL (ref 30.0–36.0)
MCV: 88 fl (ref 78.0–100.0)
Monocytes Absolute: 0.7 10*3/uL (ref 0.1–1.0)
Monocytes Relative: 14.2 % — ABNORMAL HIGH (ref 3.0–12.0)
Neutro Abs: 2.2 10*3/uL (ref 1.4–7.7)
Neutrophils Relative %: 45.2 % (ref 43.0–77.0)
Platelets: 263 10*3/uL (ref 150.0–400.0)
RBC: 4.05 Mil/uL (ref 3.87–5.11)
RDW: 16.6 % — ABNORMAL HIGH (ref 11.5–15.5)
WBC: 4.8 10*3/uL (ref 4.0–10.5)

## 2022-09-29 MED ORDER — NA SULFATE-K SULFATE-MG SULF 17.5-3.13-1.6 GM/177ML PO SOLN
1.0000 | ORAL | 0 refills | Status: DC
Start: 2022-09-29 — End: 2022-11-12

## 2022-09-29 NOTE — Progress Notes (Signed)
Patient ID: Theresa Eaton, female   DOB: 02-16-1951, 71 y.o.   MRN: [id number] HPI: Theresa Eaton is a 71 year old female known to me for colorectal cancer screening who is seen at the request of Dr. Para March to evaluate iron deficiency anemia.  She is here alone today.  She also has a history of diet-controlled diabetes, hyperlipidemia, osteopenia with prior Fosamax use, migraine headache and GERD.  Her last colonoscopy was performed on 09/06/2017.  This was a good bowel prep and complete exam.  Other than internal hemorrhoids and hypertrophied anal papilla the exam was normal.  She reports that over the last several months she has noticed fatigue and less energy plus exercise capacity.  She has not had issues with chest pain or shortness of breath.  She reports no prior history of anemia but was told in the spring when she tried to donate blood that her iron was low.  She typically donates blood on a regular basis.  She started oral iron about 2 months ago after discovering low iron and hemoglobin and symptoms are improving.  Her stools have been dark since initiating oral iron.  Prior to oral iron she had no melena or blood in stool.  No abdominal complaint including pain, bloating or change in bowel habit.  No intentional weight loss in fact she has found it difficult to lose weight despite trying to modify diet and increase exercise.  She does report history of nocturnal heartburn and was frequently eating Tums until omeprazole was started sometime ago.  She now takes omeprazole 20 mg daily and has no heartburn symptoms.  No dysphagia or odynophagia.  No nausea or vomiting or early satiety.  There is no family history of GI tract malignancy.  Past Medical History:  Diagnosis Date   Arthritis    bursitis in lt. and rt.   Chicken pox    Diabetes mellitus without complication (HCC)    history of no longer since 2015   GERD (gastroesophageal reflux disease)    Hyperlipidemia    in the past no  longer has   Osteopenia    started on fosamax 2013   Tremor     Past Surgical History:  Procedure Laterality Date   COLONOSCOPY  10/25/2005   [place]/Normal     Laser closure of greater saphenous vein, Left  2015   TUBAL LIGATION      Outpatient Medications Prior to Visit  Medication Sig Dispense Refill   Cholecalciferol (VITAMIN D3) 50 MCG (2000 UT) capsule Take 1 capsule (2,000 Units total) by mouth daily.     Iron, Ferrous Sulfate, 325 (65 Fe) MG TABS Take 325 mg by mouth daily.     Multiple Vitamins-Minerals (CENTRUM SILVER ADULT 50+ PO) Take 1 tablet by mouth daily.     omeprazole (PRILOSEC) 20 MG capsule Take 1 capsule (20 mg total) by mouth daily. 90 capsule 3   primidone (MYSOLINE) 50 MG tablet Take 1 tablet (50 mg total) by mouth in the morning and at bedtime. 60 tablet 3   propranolol (INDERAL) 10 MG tablet Take 10 mg by mouth 2 (two) times daily.     No facility-administered medications prior to visit.    Allergies  Allergen Reactions   Statins     Leg weakness    Family History  Problem Relation Age of Onset   Hyperlipidemia Mother    Dementia Mother    Cancer Father        prostate   Osteoporosis Father  Tremor Father    Cancer Sister        breast   Diabetes Sister    Breast cancer Sister    Osteoporosis Brother    Osteoporosis Maternal Aunt    Diabetes Maternal Grandmother    Heart disease Maternal Grandfather    Heart disease Paternal Grandfather    Colon cancer Neg Hx    Colon polyps Neg Hx    Esophageal cancer Neg Hx    Stomach cancer Neg Hx    Rectal cancer Neg Hx    Parkinson's disease Neg Hx     Social History   Tobacco Use   Smoking status: Never   Smokeless tobacco: Never  Vaping Use   Vaping Use: Never used  Substance Use Topics   Alcohol use: Yes    Alcohol/week: 4.0 standard drinks of alcohol    Types: 4 Glasses of wine per week    Comment: 1 glass of wine with a meal 4x/wk   Drug use: No    ROS: As per history of  present illness, otherwise negative  BP 102/60   Pulse 63   Ht 5\' 7"  (1.702 m)   Wt 215 lb (97.5 kg)   BMI 33.67 kg/m  Gen: awake, alert, NAD HEENT: anicteric  CV: RRR, no mrg Pulm: CTA b/l Abd: soft, NT/ND, +BS throughout Ext: no c/c/e Neuro: nonfocal   RELEVANT LABS AND IMAGING:    Latest Ref Rng & Units 07/30/2022    8:36 AM 03/05/2021   11:30 AM 03/09/2013   11:03 AM  CBC  WBC 4.0 - 10.5 K/uL 5.1  5.0  5.2   Hemoglobin 12.0 - 15.0 g/dL 03/11/2013  34.7  42.5   Hematocrit 36.0 - 46.0 % 34.9  39.7  37.5   Platelets 150.0 - 400.0 K/uL 265.0  255.0  246.0     CMP     Component Value Date/Time   NA 137 07/30/2022 0836   K 4.5 07/30/2022 0836   CL 102 07/30/2022 0836   CO2 27 07/30/2022 0836   GLUCOSE 113 (H) 07/30/2022 0836   BUN 18 07/30/2022 0836   CREATININE 0.80 07/30/2022 0836   CALCIUM 8.9 07/30/2022 0836   PROT 7.0 07/30/2022 0836   ALBUMIN 4.0 07/30/2022 0836   AST 17 07/30/2022 0836   ALT 10 07/30/2022 0836   ALKPHOS 75 07/30/2022 0836   BILITOT 0.5 07/30/2022 0836   Iron/TIBC/Ferritin/ %Sat    Component Value Date/Time   IRON 66 08/06/2022 1636   FERRITIN 10 (L) 08/06/2022 1636    ASSESSMENT/PLAN: 71 year old female known to me for colorectal cancer screening who is seen at the request of Dr. 65 to evaluate iron deficiency anemia.  She is here alone today.  She also has a history of diet-controlled diabetes, hyperlipidemia, osteopenia with prior Fosamax use, migraine headache and GERD.  1.  Iron deficiency anemia --new for her.  Ferritin definitively low at 10.  Approximate 2 g drop in hemoglobin.  Differential includes a slow GI blood loss or even secondary to regular blood donation.  She is up-to-date with her screening colonoscopy but with new iron deficiency anemia I recommended repeat endoscopic evaluation as follows: -- Upper endoscopy and colonoscopy in the [facility]; we reviewed the risk, benefits and alternatives and she is agreeable and wishes to  proceed -- CBC, and repeat ferritin plus IBC panel today; she has been on oral iron x 2 months -- Check celiac panel -- Consider video capsule endoscopy if endoscopic evaluation negative  2.  GERD --symptoms prior to PPI initiation.  Omeprazole 20 mg is currently very effective at controlling her heartburn symptoms. -- She will continue omeprazole 20 mg daily      JJ:HERDEY, Dwana Curd, Md [address] [place],  [place] [address]

## 2022-09-29 NOTE — Patient Instructions (Signed)
If you are age 73 or older, your body mass index should be between 23-30. Your Body mass index is 33.67 kg/m. If this is out of the aforementioned range listed, please consider follow up with your Primary Care Provider. ________________________________________________________  The [facility] providers would like to encourage you to use [facility] to communicate with providers for non-urgent requests or questions.  Due to long hold times on the telephone, sending your provider a message by [facility] may be a faster and more efficient way to get a response.  Please allow 48 business hours for a response.  Please remember that this is for non-urgent requests.  _______________________________________________________  Your provider has requested that you go to the basement level for lab work before leaving today. Press "B" on the elevator. The lab is located at the first door on the left as you exit the elevator.  You have been scheduled for an endoscopy and colonoscopy. Please follow the written instructions given to you at your visit today. Please pick up your prep supplies at the pharmacy within the next 1-3 days. If you use inhalers (even only as needed), please bring them with you on the day of your procedure.  Due to recent changes in healthcare laws, you may see the results of your imaging and laboratory studies on MyChart before your provider has had a chance to review them.  We understand that in some cases there may be results that are confusing or concerning to you. Not all laboratory results come back in the same time frame and the provider may be waiting for multiple results in order to interpret others.  Please give [place] 48 hours in order for your provider to thoroughly review all the results before contacting the office for clarification of your results.   Thank you for entrusting me with your care and choosing [facility].  Dr Rhea Belton

## 2022-09-30 LAB — TISSUE TRANSGLUTAMINASE, IGA: (tTG) Ab, IgA: <1 U/mL

## 2022-09-30 LAB — IGA: Immunoglobulin A: 202 mg/dL (ref 70–320)

## 2022-10-01 ENCOUNTER — Ambulatory Visit
Admission: RE | Admit: 2022-10-01 | Discharge: 2022-10-01 | Disposition: A | Discharge status: Home / Self Care | Payer: Medicare Other | Source: Ambulatory Visit | Attending: Family Medicine | Admitting: Family Medicine

## 2022-10-01 DIAGNOSIS — Z780 Asymptomatic menopausal state: Secondary | ICD-10-CM

## 2022-10-01 DIAGNOSIS — Z1239 Encounter for other screening for malignant neoplasm of breast: Secondary | ICD-10-CM

## 2022-11-04 ENCOUNTER — Encounter: Payer: Self-pay | Admitting: Internal Medicine

## 2022-11-07 ENCOUNTER — Encounter: Payer: Self-pay | Admitting: Certified Registered Nurse Anesthetist

## 2022-11-12 ENCOUNTER — Ambulatory Visit (AMBULATORY_SURGERY_CENTER): Payer: Medicare Other | Admitting: Internal Medicine

## 2022-11-12 ENCOUNTER — Encounter: Payer: Self-pay | Admitting: Internal Medicine

## 2022-11-12 VITALS — BP 120/71 | HR 59 | Temp 96.0°F | Resp 12 | Ht 67.0 in | Wt 215.0 lb

## 2022-11-12 DIAGNOSIS — D509 Iron deficiency anemia, unspecified: Secondary | ICD-10-CM | POA: Diagnosis not present

## 2022-11-12 DIAGNOSIS — K649 Unspecified hemorrhoids: Secondary | ICD-10-CM | POA: Diagnosis not present

## 2022-11-12 DIAGNOSIS — K319 Disease of stomach and duodenum, unspecified: Secondary | ICD-10-CM | POA: Diagnosis not present

## 2022-11-12 DIAGNOSIS — K219 Gastro-esophageal reflux disease without esophagitis: Secondary | ICD-10-CM

## 2022-11-12 HISTORY — PX: COLONOSCOPY WITH ESOPHAGOGASTRODUODENOSCOPY (EGD): SHX5779

## 2022-11-12 MED ORDER — SODIUM CHLORIDE 0.9 % IV SOLN: 500.0000 mL | Freq: Once | INTRAVENOUS | Status: DC

## 2022-11-12 NOTE — Op Note (Signed)
[facility] Patient Name: Theresa Eaton Procedure Date: 11/12/2022 2:09 PM MRN: [id number] Endoscopist: Jerene Bears , MD, [id number] Age: 72 Referring MD:  Date of Birth: 1951/07/10 Gender: Female Account #: [id number] Procedure:                Colonoscopy Indications:              Iron deficiency anemia Medicines:                Monitored Anesthesia Care Procedure:                Pre-Anesthesia Assessment:                           - Prior to the procedure, a History and Physical                            was performed, and patient medications and                            allergies were reviewed. The patient's tolerance of                            previous anesthesia was also reviewed. The risks                            and benefits of the procedure and the sedation                            options and risks were discussed with the patient.                            All questions were answered, and informed consent                            was obtained. Prior Anticoagulants: The patient has                            taken no anticoagulant or antiplatelet agents. ASA                            Grade Assessment: II - A patient with mild systemic                            disease. After reviewing the risks and benefits,                            the patient was deemed in satisfactory condition to                            undergo the procedure.                           - Prior to the procedure, a History and Physical  was performed, and patient medications and                            allergies were reviewed. The patient's tolerance of                            previous anesthesia was also reviewed. The risks                            and benefits of the procedure and the sedation                            options and risks were discussed with the patient.                            All questions were answered, and informed  consent                            was obtained. Prior Anticoagulants: The patient has                            taken no anticoagulant or antiplatelet agents. ASA                            Grade Assessment: II - A patient with mild systemic                            disease. After reviewing the risks and benefits,                            the patient was deemed in satisfactory condition to                            undergo the procedure.                           After obtaining informed consent, the colonoscope                            was passed under direct vision. Throughout the                            procedure, the patient's blood pressure, pulse, and                            oxygen saturations were monitored continuously. The                            CF [id number] #[id number] was introduced through the anus                            and advanced to the cecum, identified by  appendiceal orifice and ileocecal valve. The                            colonoscopy was performed without difficulty. The                            patient tolerated the procedure well. The quality                            of the bowel preparation was good. The ileocecal                            valve, appendiceal orifice, and rectum were                            photographed. Scope In: 2:18:48 PM Scope Out: 2:36:29 PM Scope Withdrawal Time: 0 hours 14 minutes 7 seconds  Total Procedure Duration: 0 hours 17 minutes 41 seconds  Findings:                 The digital rectal exam was normal.                           The colon (entire examined portion) appeared normal.                           Internal hemorrhoids were found during                            retroflexion. The hemorrhoids were small. Complications:            No immediate complications. Estimated Blood Loss:     Estimated blood loss: none. Impression:               - The entire examined colon is normal.                            - Small internal hemorrhoids.                           - No specimens collected.                           - Pictures from EGD performed today included in                            this report. Recommendation:           - Patient has a contact number available for                            emergencies. The signs and symptoms of potential                            delayed complications were discussed with the  patient. Return to normal activities tomorrow.                            Written discharge instructions were provided to the                            patient.                           - Resume previous diet.                           - Continue present medications. Continue oral iron                            supplementation and monitor Hgb and ferritin to                            ensure normalization.                           - No repeat colonoscopy due to age at next                            surveillance interval (10 years) and the absence of                            colonic polyps on today's exam.                           - Proceed with [facility] to complete GI evaluation of new                            IDA. Jerene Bears, MD 11/12/2022 2:42:38 PM This report has been signed electronically.

## 2022-11-12 NOTE — Patient Instructions (Signed)
Handout on hemorrhoids given to you today  Await pathology results   YOU HAD AN ENDOSCOPIC PROCEDURE TODAY AT [facility]:   Refer to the procedure report that was given to you for any specific questions about what was found during the examination.  If the procedure report does not answer your questions, please call your gastroenterologist to clarify.  If you requested that your care partner not be given the details of your procedure findings, then the procedure report has been included in a sealed envelope for you to review at your convenience later.  YOU SHOULD EXPECT: Some feelings of bloating in the abdomen. Passage of more gas than usual.  Walking can help get rid of the air that was put into your GI tract during the procedure and reduce the bloating. If you had a lower endoscopy (such as a colonoscopy or flexible sigmoidoscopy) you may notice spotting of blood in your stool or on the toilet paper. If you underwent a bowel prep for your procedure, you may not have a normal bowel movement for a few days.  Please Note:  You might notice some irritation and congestion in your nose or some drainage.  This is from the oxygen used during your procedure.  There is no need for concern and it should clear up in a day or so.  SYMPTOMS TO REPORT IMMEDIATELY:  Following lower endoscopy (colonoscopy or flexible sigmoidoscopy):  Excessive amounts of blood in the stool  Significant tenderness or worsening of abdominal pains  Swelling of the abdomen that is new, acute  Fever of 100F or higher  Following upper endoscopy (EGD)  Vomiting of blood or coffee ground material  New chest pain or pain under the shoulder blades  Painful or persistently difficult swallowing  New shortness of breath  Fever of 100F or higher  Black, tarry-looking stools  For urgent or emergent issues, a gastroenterologist can be reached at any hour by calling [phone]. Do not use MyChart messaging for  urgent concerns.    DIET:  We do recommend a small meal at first, but then you may proceed to your regular diet.  Drink plenty of fluids but you should avoid alcoholic beverages for 24 hours.  ACTIVITY:  You should plan to take it easy for the rest of today and you should NOT DRIVE or use heavy machinery until tomorrow (because of the sedation medicines used during the test).    FOLLOW UP: Our staff will call the number listed on your records the next business day following your procedure.  We will call around 7:15- 8:00 am to check on you and address any questions or concerns that you may have regarding the information given to you following your procedure. If we do not reach you, we will leave a message.     If any biopsies were taken you will be contacted by phone or by letter within the next 1-3 weeks.  Please call us at [phone] if you have not heard about the biopsies in 3 weeks.    SIGNATURES/CONFIDENTIALITY: You and/or your care partner have signed paperwork which will be entered into your electronic medical record.  These signatures attest to the fact that that the information above on your After Visit Summary has been reviewed and is understood.  Full responsibility of the confidentiality of this discharge information lies with you and/or your care-partner.

## 2022-11-12 NOTE — Op Note (Signed)
[facility] Patient Name: Theresa Eaton Procedure Date: 11/12/2022 1:57 PM MRN: [id number] Endoscopist: Beverley Fiedler , MD, [id number] Age: 72 Referring MD:  Date of Birth: [birth year] Gender: Female Account #: [id number] Procedure:                Upper GI endoscopy Indications:              Iron deficiency anemia Medicines:                Monitored Anesthesia Care Procedure:                Pre-Anesthesia Assessment:                           - Prior to the procedure, a History and Physical                            was performed, and patient medications and                            allergies were reviewed. The patient's tolerance of                            previous anesthesia was also reviewed. The risks                            and benefits of the procedure and the sedation                            options and risks were discussed with the patient.                            All questions were answered, and informed consent                            was obtained. Prior Anticoagulants: The patient has                            taken no anticoagulant or antiplatelet agents. ASA                            Grade Assessment: II - A patient with mild systemic                            disease. After reviewing the risks and benefits,                            the patient was deemed in satisfactory condition to                            undergo the procedure.                           After obtaining informed consent, the endoscope was  passed under direct vision. Throughout the                            procedure, the patient's blood pressure, pulse, and                            oxygen saturations were monitored continuously. The                            GIF [id number] was introduced through the                            mouth, and advanced to the second part of duodenum.                            The upper GI endoscopy was  accomplished without                            difficulty. The patient tolerated the procedure                            well. Findings:                 The examined esophagus was normal.                           The entire examined stomach was normal. Biopsies                            were taken with a cold forceps for histology and                            Helicobacter pylori testing.                           The examined duodenum was normal. Biopsies for                            histology were taken with a cold forceps for                            evaluation of celiac disease. Complications:            No immediate complications. Estimated Blood Loss:     Estimated blood loss: none. Impression:               - Normal esophagus.                           - Normal stomach. Biopsied.                           - Normal examined duodenum. Biopsied. Recommendation:           - Patient has a contact number available for  emergencies. The signs and symptoms of potential                            delayed complications were discussed with the                            patient. Return to normal activities tomorrow.                            Written discharge instructions were provided to the                            patient.                           - Resume previous diet.                           - Continue present medications.                           - Await pathology results.                           - See the other procedure note for documentation of                            additional recommendations. Jerene Bears, MD 11/12/2022 2:38:50 PM This report has been signed electronically.

## 2022-11-12 NOTE — Progress Notes (Signed)
Called to room to assist during endoscopic procedure.  Patient ID and intended procedure confirmed with present staff. Received instructions for my participation in the procedure from the performing physician.

## 2022-11-12 NOTE — Progress Notes (Signed)
1330 Robinul 0.1 mg IV given due large amount of secretions upon assessment.  MD made aware, vss  °

## 2022-11-12 NOTE — Progress Notes (Signed)
Report given to PACU, vss 

## 2022-11-12 NOTE — Progress Notes (Signed)
Pt's states no medical or surgical changes since previsit or office visit. 

## 2022-11-12 NOTE — Progress Notes (Signed)
GASTROENTEROLOGY PROCEDURE H&P NOTE   Primary Care Physician: Tonia Ghent, MD    Reason for Procedure:  Iron deficiency anemia  Plan:    Upper and lower endoscopy  Patient is appropriate for endoscopic procedure(s) in the ambulatory ([place]) setting.  The nature of the procedure, as well as the risks, benefits, and alternatives were carefully and thoroughly reviewed with the patient. Ample time for discussion and questions allowed. The patient understood, was satisfied, and agreed to proceed.     HPI: Theresa Eaton is a 72 y.o. female who presents for EGD and colonoscopy.  Medical history as below.  Tolerated the prep.  No recent chest pain or shortness of breath.  No abdominal pain today.  Past Medical History:  Diagnosis Date   Arthritis    bursitis in lt. and rt.   Chicken pox    Diabetes mellitus without complication ([place])    history of no longer since 2015   GERD (gastroesophageal reflux disease)    Hyperlipidemia    in the past no longer has   Osteopenia    started on fosamax 2013   Tremor     Past Surgical History:  Procedure Laterality Date   COLONOSCOPY  10/25/2005   [place]/Normal     COLONOSCOPY WITH ESOPHAGOGASTRODUODENOSCOPY (EGD)  11/12/2022   Laser closure of greater saphenous vein, Left  2015   TUBAL LIGATION      Prior to Admission medications   Medication Sig Start Date End Date Taking? Authorizing Provider  Cholecalciferol (VITAMIN D3) 50 MCG (2000 UT) capsule Take 1 capsule (2,000 Units total) by mouth daily. 08/23/22  Yes Tonia Ghent, MD  Multiple Vitamins-Minerals (CENTRUM SILVER ADULT 50+ PO) Take 1 tablet by mouth daily.   Yes [provider]  omeprazole (PRILOSEC) 20 MG capsule Take 1 capsule (20 mg total) by mouth daily. 08/06/22  Yes Tonia Ghent, MD  primidone (MYSOLINE) 50 MG tablet Take 1 tablet (50 mg total) by mouth in the morning and at bedtime. 08/11/22  Yes Star Age, MD  propranolol (INDERAL) 10 MG  tablet Take 10 mg by mouth 2 (two) times daily.   Yes [provider]  Iron, Ferrous Sulfate, 325 (65 Fe) MG TABS Take 325 mg by mouth daily. 08/08/22   Tonia Ghent, MD    Current Outpatient Medications  Medication Sig Dispense Refill   Cholecalciferol (VITAMIN D3) 50 MCG (2000 UT) capsule Take 1 capsule (2,000 Units total) by mouth daily.     Multiple Vitamins-Minerals (CENTRUM SILVER ADULT 50+ PO) Take 1 tablet by mouth daily.     omeprazole (PRILOSEC) 20 MG capsule Take 1 capsule (20 mg total) by mouth daily. 90 capsule 3   primidone (MYSOLINE) 50 MG tablet Take 1 tablet (50 mg total) by mouth in the morning and at bedtime. 60 tablet 3   propranolol (INDERAL) 10 MG tablet Take 10 mg by mouth 2 (two) times daily.     Iron, Ferrous Sulfate, 325 (65 Fe) MG TABS Take 325 mg by mouth daily.     Current Facility-Administered Medications  Medication Dose Route Frequency Provider Last Rate Last Admin   0.9 %  sodium chloride infusion  500 mL Intravenous Once Georgio Hattabaugh, Lajuan Lines, MD        Allergies as of 11/12/2022 - Review Complete 11/12/2022  Allergen Reaction Noted   Statins  02/04/2014    Family History  Problem Relation Age of Onset   Hyperlipidemia Mother    Dementia  Mother    Cancer Father        prostate   Osteoporosis Father    Tremor Father    Cancer Sister        breast   Diabetes Sister    Breast cancer Sister    Osteoporosis Brother    Osteoporosis Maternal Aunt    Diabetes Maternal Grandmother    Heart disease Maternal Grandfather    Heart disease Paternal Grandfather    Colon cancer Neg Hx    Colon polyps Neg Hx    Esophageal cancer Neg Hx    Stomach cancer Neg Hx    Rectal cancer Neg Hx    Parkinson's disease Neg Hx     Social History   Socioeconomic History   Marital status: Married    Spouse name: Not on file   Number of children: Not on file   Years of education: Not on file   Highest education level: Not on file  Occupational History    Not on file  Tobacco Use   Smoking status: Never   Smokeless tobacco: Never  Vaping Use   Vaping Use: Never used  Substance and Sexual Activity   Alcohol use: Yes    Alcohol/week: 4.0 standard drinks of alcohol    Types: 4 Glasses of wine per week    Comment: 1 glass of wine with a meal 4x/wk   Drug use: Never   Sexual activity: Not Currently    Birth control/protection: Post-menopausal  Other Topics Concern   Not on file  Social History Narrative   MDIV, Billey Chang priest   Married 1973   3 kids- her daughter is a family medicine MD (med school at [employer]).   Social Determinants of Health   Financial Resource Strain: Low Risk  (04/15/2021)   Overall Financial Resource Strain (CARDIA)    Difficulty of Paying Living Expenses: Not hard at all  Food Insecurity: No Food Insecurity (04/15/2021)   Hunger Vital Sign    Worried About Running Out of Food in the Last Year: Never true    Ran Out of Food in the Last Year: Never true  Transportation Needs: No Transportation Needs (04/15/2021)   PRAPARE - Hydrologist (Medical): No    Lack of Transportation (Non-Medical): No  Physical Activity: Sufficiently Active (04/15/2021)   Exercise Vital Sign    Days of Exercise per Week: 7 days    Minutes of Exercise per Session: 30 min  Stress: No Stress Concern Present (04/15/2021)   [place]    Feeling of Stress : Not at all  Social Connections: Not on file  Intimate Partner Violence: Not At Risk (04/15/2021)   Humiliation, Afraid, Rape, and Kick questionnaire    Fear of Current or Ex-Partner: No    Emotionally Abused: No    Physically Abused: No    Sexually Abused: No    Physical Exam: Vital signs in last 24 hours: @BP  (!) 152/88   Pulse (!) 58   Temp (!) 96 F (35.6 C) (Temporal)   Ht 5\' 7"  (1.702 m)   Wt 215 lb (97.5 kg)   SpO2 97%   BMI 33.67 kg/m  GEN: NAD EYE: Sclerae anicteric ENT:  MMM CV: Non-tachycardic Pulm: CTA b/l GI: Soft, NT/ND NEURO:  Alert & Oriented x 3   Zenovia Jarred, MD [facility]  11/12/2022 2:01 PM

## 2022-11-15 ENCOUNTER — Telehealth: Payer: Self-pay | Admitting: *Deleted

## 2022-11-15 NOTE — Telephone Encounter (Signed)
  Follow up Call-     11/12/2022    1:08 PM  Call back number  Post procedure Call Back phone  # [phone]  Permission to leave phone message Yes     Patient questions:  Do you have a fever, pain , or abdominal swelling? No. Pain Score  0 *  Have you tolerated food without any problems? Yes.    Have you been able to return to your normal activities? Yes.    Do you have any questions about your discharge instructions: Diet   No. Medications  No. Follow up visit  No.  Do you have questions or concerns about your Care? No.  Actions: * If pain score is 4 or above: No action needed, pain <4.

## 2022-11-18 ENCOUNTER — Telehealth: Payer: Self-pay

## 2022-11-18 DIAGNOSIS — D509 Iron deficiency anemia, unspecified: Secondary | ICD-10-CM

## 2022-11-18 NOTE — Telephone Encounter (Signed)
Left message for pt to call back. Dr. Hilarie Fredrickson wanted pt to have VCE for IDA.  Pt called back and is scheduled for VCE 12/23/22 at 8:30am. Pt aware of appt. Referral in epic. Instructions sent to pt via mychart.

## 2022-11-23 ENCOUNTER — Encounter: Payer: Self-pay | Admitting: Internal Medicine

## 2022-11-23 NOTE — Progress Notes (Unsigned)
[facility] [address] [place]. [place] [address] [phone]       OFFICE FOLLOW UP NOTE  Theresa Eaton Date of Birth:  February 11, 1951 Medical Record Number:  [id number]    Primary neurologist: Dr. Rexene Alberts Reason for visit: Essential tremor    SUBJECTIVE:   CHIEF COMPLAINT:  No chief complaint on file.   HPI:    Update 11/24/2022 JM: Patient returns for 83-month follow-up regarding essential tremor.  At prior visit, recommended continuation of propranolol 10 mg twice daily and increase primidone gradually to 50 mg twice daily.        Consult visit 08/11/2022 Dr. Rexene Alberts: Theresa Eaton is a 72 year old female with an underlying medical history of reflux disease, hyperlipidemia, osteopenia, diabetes, arthritis, edema of both lower extremities, and obesity, who reports a bilateral upper extremity tremor for the past 18 mo.  I reviewed your office note from 08/06/2022.  She has been on Mysoline 50 mg strength and also takes propranolol 10 mg twice daily.  She has been on these medications for the past several months, for started on low-dose propranolol twice daily.  She then started Mysoline 25 mg at night and then increased it to the current dose of 50 mg at night.  She tolerates the medication, denies any sleepiness from it.  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  Her father had hand tremors.  She has 2 sisters, neither 1 with tremors, her 3 children have no tremors.  She has limited her caffeine, generally drinks mostly decaf coffee.  She drinks alcohol in the form of wine, 1 glass about 3 days out of the week.  She has noticed a connection to stress exacerbating her tremors.  She has been sleeping fairly well.  She does have a history of snoring.  She does have sleep disruption from nocturia about 2-3 times, depending on how bad her leg  swelling is.  She has bilateral leg edema.  She is a non-smoker.  She tries to hydrate well with water. She had recent blood work through your office on 08/06/2022 and I reviewed the results: Vitamin B-12 was 551, ferritin low at 10, A1c was 6.4.  On 07/30/2022 her TSH was 4.18, vitamin D borderline at 31.9, total cholesterol 222, triglycerides 128, HDL 50.8, LDL elevated at 146.    ROS:   14 system review of systems performed and negative with exception of ***  PMH:  Past Medical History:  Diagnosis Date   Arthritis    bursitis in lt. and rt.   Chicken pox    Diabetes mellitus without complication ([place])    history of no longer since 2015   GERD (gastroesophageal reflux disease)    Hyperlipidemia    in the past no longer has   Osteopenia    started on fosamax 2013   Tremor     PSH:  Past Surgical History:  Procedure Laterality Date   COLONOSCOPY  10/25/2005   [place]/Normal     COLONOSCOPY WITH ESOPHAGOGASTRODUODENOSCOPY (EGD)  11/12/2022   Laser closure of greater saphenous vein, Left  2015   TUBAL LIGATION      Social History:  Social History   Socioeconomic History   Marital status: Married    Spouse name: Not on file   Number of children: Not on file   Years of education: Not on file  Highest education level: Not on file  Occupational History   Not on file  Tobacco Use   Smoking status: Never   Smokeless tobacco: Never  Vaping Use   Vaping Use: Never used  Substance and Sexual Activity   Alcohol use: Yes    Alcohol/week: 4.0 standard drinks of alcohol    Types: 4 Glasses of wine per week    Comment: 1 glass of wine with a meal 4x/wk   Drug use: Never   Sexual activity: Not Currently    Birth control/protection: Post-menopausal  Other Topics Concern   Not on file  Social History Narrative   MDIV, Billey Chang priest   Married 1973   3 kids- her daughter is a family medicine MD (med school at [employer]).   Social Determinants of Health   Financial Resource  Strain: Low Risk  (04/15/2021)   Overall Financial Resource Strain (CARDIA)    Difficulty of Paying Living Expenses: Not hard at all  Food Insecurity: No Food Insecurity (04/15/2021)   Hunger Vital Sign    Worried About Running Out of Food in the Last Year: Never true    Ran Out of Food in the Last Year: Never true  Transportation Needs: No Transportation Needs (04/15/2021)   PRAPARE - Hydrologist (Medical): No    Lack of Transportation (Non-Medical): No  Physical Activity: Sufficiently Active (04/15/2021)   Exercise Vital Sign    Days of Exercise per Week: 7 days    Minutes of Exercise per Session: 30 min  Stress: No Stress Concern Present (04/15/2021)   [place]    Feeling of Stress : Not at all  Social Connections: Not on file  Intimate Partner Violence: Not At Risk (04/15/2021)   Humiliation, Afraid, Rape, and Kick questionnaire    Fear of Current or Ex-Partner: No    Emotionally Abused: No    Physically Abused: No    Sexually Abused: No    Family History:  Family History  Problem Relation Age of Onset   Hyperlipidemia Mother    Dementia Mother    Cancer Father        prostate   Osteoporosis Father    Tremor Father    Cancer Sister        breast   Diabetes Sister    Breast cancer Sister    Osteoporosis Brother    Osteoporosis Maternal Aunt    Diabetes Maternal Grandmother    Heart disease Maternal Grandfather    Heart disease Paternal Grandfather    Colon cancer Neg Hx    Colon polyps Neg Hx    Esophageal cancer Neg Hx    Stomach cancer Neg Hx    Rectal cancer Neg Hx    Parkinson's disease Neg Hx     Medications:   Current Outpatient Medications on File Prior to Visit  Medication Sig Dispense Refill   Cholecalciferol (VITAMIN D3) 50 MCG (2000 UT) capsule Take 1 capsule (2,000 Units total) by mouth daily.     Iron, Ferrous Sulfate, 325 (65 Fe) MG TABS Take 325 mg by  mouth daily.     Multiple Vitamins-Minerals (CENTRUM SILVER ADULT 50+ PO) Take 1 tablet by mouth daily.     omeprazole (PRILOSEC) 20 MG capsule Take 1 capsule (20 mg total) by mouth daily. 90 capsule 3   primidone (MYSOLINE) 50 MG tablet Take 1 tablet (50 mg total) by mouth in the morning and at bedtime.  60 tablet 3   propranolol (INDERAL) 10 MG tablet Take 10 mg by mouth 2 (two) times daily.     No current facility-administered medications on file prior to visit.    Allergies:   Allergies  Allergen Reactions   Statins     Leg weakness      OBJECTIVE:  Physical Exam  There were no vitals filed for this visit. There is no height or weight on file to calculate BMI. No results found.   General: well developed, well nourished, seated, in no evident distress Head: head normocephalic and atraumatic.   Neck: supple with no carotid or supraclavicular bruits Cardiovascular: regular rate and rhythm, no murmurs Musculoskeletal: no deformity Skin:  no rash/petichiae Vascular:  Normal pulses all extremities   Neurologic Exam Mental Status: Awake and fully alert. Oriented to place and time. Recent and remote memory intact. Attention span, concentration and fund of knowledge appropriate. Mood and affect appropriate.  Cranial Nerves: Pupils equal, briskly reactive to light. Extraocular movements full without nystagmus. Visual fields full to confrontation. Hearing intact. Facial sensation intact. Face, tongue, palate moves normally and symmetrically.  Motor: Normal bulk and tone. Normal strength in all tested extremity muscles Sensory.: intact to touch , pinprick , position and vibratory sensation.  Coordination: Rapid alternating movements normal in all extremities. Finger-to-nose and heel-to-shin performed accurately bilaterally. Gait and Station: Arises from chair without difficulty. Stance is normal. Gait demonstrates normal stride length and balance without use of AD. Tandem walk and  heel toe without difficulty.  Reflexes: 1+ and symmetric. Toes downgoing.         ASSESSMENT/PLAN: Theresa Eaton is a 72 y.o. year old female    1.  Essential tremor -Continue primidone 50 mg twice daily -Continue propranolol 10 mg twice daily     Follow up in *** or call earlier if needed   CC:  PCP: Tonia Ghent, MD    I spent *** minutes of face-to-face and non-face-to-face time with patient.  This included previsit chart review, lab review, study review, order entry, electronic health record documentation, patient education regarding ***   Frann Rider, AGNP-BC  [facility] [address] [place], [place] [address]  Phone [phone] Fax [phone] Note: This document was prepared with digital dictation and possible smart phrase technology. Any transcriptional errors that result from this process are unintentional.

## 2022-11-24 ENCOUNTER — Encounter: Payer: Self-pay | Admitting: Adult Health

## 2022-11-24 ENCOUNTER — Ambulatory Visit: Payer: Medicare Other | Admitting: Adult Health

## 2022-11-24 VITALS — BP 123/65 | HR 67 | Ht 67.0 in | Wt 210.0 lb

## 2022-11-24 DIAGNOSIS — G250 Essential tremor: Secondary | ICD-10-CM | POA: Diagnosis not present

## 2022-11-24 MED ORDER — PROPRANOLOL HCL 10 MG PO TABS: 10.0000 mg | ORAL_TABLET | Freq: Two times a day (BID) | ORAL | 3 refills | Status: DC

## 2022-11-24 MED ORDER — PRIMIDONE 50 MG PO TABS: 50.0000 mg | ORAL_TABLET | Freq: Two times a day (BID) | ORAL | 3 refills | Status: DC

## 2022-11-24 NOTE — Patient Instructions (Addendum)
Continue propranolol 10mg  twice daily  Continue primidone 50mg  twice daily   Please let me know if you would like to do any occupational therapy to see if this helps with stiffness    Follow up in 6 months or call earlier if needed      Essential Tremor A tremor is trembling or shaking that a person cannot control. Most tremors affect the hands or arms. Tremors can also affect the head, vocal cords, legs, and other parts of the body. Essential tremor is a tremor without a known cause. Usually, it occurs while a person is trying to perform an action. It tends to get worse gradually as a person ages. What are the causes? The cause of this condition is not known, but it often runs in families. What increases the risk? You are more likely to develop this condition if: You have a family member with essential tremor. You are 30 years of age or older. What are the signs or symptoms? The main sign of a tremor is a rhythmic shaking of certain parts of your body that is uncontrolled and unintentional. You may: Have difficulty eating with a spoon or fork. Have difficulty writing. Nod your head up and down or side to side. Have a quivering voice. The shaking may: Get worse over time. Come and go. Be more noticeable on one side of your body. Get worse due to stress, tiredness (fatigue), caffeine, and extreme heat or cold. How is this diagnosed? This condition may be diagnosed based on: Your symptoms and medical history. A physical exam. There is no single test to diagnose an essential tremor. However, your health care provider may order tests to rule out other causes of your condition. These may include: Blood and urine tests. Imaging studies of your brain, such as a CT scan or MRI. How is this treated? Treatment for essential tremor depends on the severity of the condition. Mild tremors may not need treatment if they do not affect your day-to-day life. Severe tremors may need to be  treated using one or more of the following options: Medicines. Injections of a substance called botulinum toxin. Procedures such as deep brain stimulation (DBS) implantation or MRI-guided ultrasound treatment. Lifestyle changes. Occupational or physical therapy. Follow these instructions at home: Lifestyle  Do not use any products that contain nicotine or tobacco. These products include cigarettes, chewing tobacco, and vaping devices, such as e-cigarettes. If you need help quitting, ask your health care provider. Limit your caffeine intake as told by your health care provider. Try to get 8 hours of sleep each night. Find ways to manage your stress that fit your lifestyle and personality. Consider trying meditation or yoga. Try to anticipate stressful situations and allow extra time to manage them. If you are struggling emotionally with the effects of your tremor, consider working with a mental health provider. General instructions Take over-the-counter and prescription medicines only as told by your health care provider. Avoid extreme heat and extreme cold. Keep all follow-up visits. This is important. Visits may include physical therapy visits. Where to find more information [employer] of Neurological Disorders and Stroke: [employer] Contact a health care provider if: You experience any changes in the location or intensity of your tremors. You start having a tremor after starting a new medicine. You have a tremor with other symptoms, such as: Numbness. Tingling. Pain. Weakness. Your tremor gets worse. Your tremor interferes with your daily life. You feel down, blue, or sad for at least 2  weeks in a row. Worrying about your tremor and what other people think about you interferes with your everyday life functions, including relationships, work, or school. Summary Essential tremor is a tremor without a known cause. Usually, it occurs when you are trying to perform an  action. You are more likely to develop this condition if you have a family member with essential tremor. The main sign of a tremor is a rhythmic shaking of certain parts of your body that is uncontrolled and unintentional. Treatment for essential tremor depends on the severity of the condition. This information is not intended to replace advice given to you by your health care provider. Make sure you discuss any questions you have with your health care provider. Document Revised: 07/31/2021 Document Reviewed: 07/31/2021 Elsevier Patient Education  [place].

## 2022-12-23 ENCOUNTER — Ambulatory Visit (INDEPENDENT_AMBULATORY_CARE_PROVIDER_SITE_OTHER): Payer: Medicare Other | Admitting: Internal Medicine

## 2022-12-23 DIAGNOSIS — D509 Iron deficiency anemia, unspecified: Secondary | ICD-10-CM | POA: Diagnosis not present

## 2022-12-23 NOTE — Plan of Care (Signed)
CAPSULE ID: [id number] Exp: 2024-05-16 LOT: [id number]  Patient arrived for capsule endoscopy. Reported the prep went well. Confirmed patient is fasting. Explained dietary restrictions for the next few hours. Patient verbalized understanding. Opened capsule, ensured capsule was flashing and transmitting to the recorder prior to the patient swallowing the capsule. Patient swallowed capsule without difficulty. Patient told to call the office with any questions. Understands to return to the office today between 4:00 and 4:30 pm. No further questions by the conclusion of the visit.

## 2022-12-23 NOTE — Patient Instructions (Signed)
  The capsule endoscopy procedure will last approximately 8 hours. Contact your doctor's office immediately if you suffer from any abdominal pain, nausea or vomiting during the procedure. 1. You may drink colorless liquids starting 2 hours after swallowing the capsule.   2. You may have a light snack  4 hours after ingestion. After the examination is completed you may return to your normal diet.  3. Check the blue flashing DataRecorder light a couple of times through the day. If is stops blinking or changes color, note the time and contact your doctor.  4. Avoid strong electromagnetic fields such as MRI devices or ham radios after swallowing the capsule and until you pass it in a bowel movement.  5. Do not disconnect the equipment or completely remove the DataRecorder at any time during the procedure.  6. Treat the DataRecorder carefully. Avoid sudden movements and banging the DataRecorder.  7. Avoid direct exposure to bright sunlight.  8. Return to the office at 4:00 pm to have the recording equipment removed.  Clear Liquid Diet Examples: Black Coffee (non-dairy creamer ok) Jell-O (NO fruit added & NO red Jell-o) Water Bouillon (Chicken or Beef) 7-up Cranberry Juice Apple Juice Popsicles (NO red) Tea Coke Sprite Pepsi Ginger Ale Gatorade Mt Dew Dr Pepper Light Snack Examples: Soup Cereal 1/2 Sandwich Salad Eggs Potatoes Toast Rice  

## 2023-01-05 ENCOUNTER — Other Ambulatory Visit: Payer: Self-pay

## 2023-01-05 DIAGNOSIS — D509 Iron deficiency anemia, unspecified: Secondary | ICD-10-CM

## 2023-01-07 ENCOUNTER — Encounter: Payer: Self-pay | Admitting: Internal Medicine

## 2023-01-10 ENCOUNTER — Telehealth: Payer: Self-pay

## 2023-01-10 NOTE — Telephone Encounter (Signed)
Pt was made aware of Capsule Endoscopy results and Dr. Hilarie Fredrickson recommendations. Orders for labs placed in Epic  Pt made aware. Pt notified to have labs repeated in 3 months, continue oral iron: Follow Up Appointment scheduled on 04/12/2023 to see Dr. Hilarie Fredrickson. Pt made aware.  Pt verbalized understanding with all questions answered.

## 2023-04-07 ENCOUNTER — Other Ambulatory Visit (INDEPENDENT_AMBULATORY_CARE_PROVIDER_SITE_OTHER): Payer: Medicare Other

## 2023-04-07 DIAGNOSIS — D509 Iron deficiency anemia, unspecified: Secondary | ICD-10-CM | POA: Diagnosis not present

## 2023-04-07 LAB — IBC + FERRITIN
Ferritin: 39 ng/mL (ref 10.0–291.0)
Iron: 95 ug/dL (ref 42–145)
Saturation Ratios: 36.5 % (ref 20.0–50.0)
TIBC: 260.4 ug/dL (ref 250.0–450.0)
Transferrin: 186 mg/dL — ABNORMAL LOW (ref 212.0–360.0)

## 2023-04-07 LAB — CBC WITH DIFFERENTIAL/PLATELET
Basophils Absolute: 0 10*3/uL (ref 0.0–0.1)
Basophils Relative: 0.7 % (ref 0.0–3.0)
Eosinophils Absolute: 0.1 10*3/uL (ref 0.0–0.7)
Eosinophils Relative: 1.5 % (ref 0.0–5.0)
HCT: 39.1 % (ref 36.0–46.0)
Hemoglobin: 13 g/dL (ref 12.0–15.0)
Lymphocytes Relative: 38.1 % (ref 12.0–46.0)
Lymphs Abs: 1.9 10*3/uL (ref 0.7–4.0)
MCHC: 33.2 g/dL (ref 30.0–36.0)
MCV: 93.1 fl (ref 78.0–100.0)
Monocytes Absolute: 0.5 10*3/uL (ref 0.1–1.0)
Monocytes Relative: 10.1 % (ref 3.0–12.0)
Neutro Abs: 2.4 10*3/uL (ref 1.4–7.7)
Neutrophils Relative %: 49.6 % (ref 43.0–77.0)
Platelets: 228 10*3/uL (ref 150.0–400.0)
RBC: 4.2 Mil/uL (ref 3.87–5.11)
RDW: 12.7 % (ref 11.5–15.5)
WBC: 4.9 10*3/uL (ref 4.0–10.5)

## 2023-04-12 ENCOUNTER — Ambulatory Visit: Payer: Medicare Other | Admitting: Internal Medicine

## 2023-04-12 ENCOUNTER — Encounter: Payer: Self-pay | Admitting: Internal Medicine

## 2023-04-12 VITALS — BP 124/72 | HR 56 | Ht 67.0 in | Wt 205.4 lb

## 2023-04-12 DIAGNOSIS — R197 Diarrhea, unspecified: Secondary | ICD-10-CM | POA: Diagnosis not present

## 2023-04-12 DIAGNOSIS — K219 Gastro-esophageal reflux disease without esophagitis: Secondary | ICD-10-CM

## 2023-04-12 DIAGNOSIS — D509 Iron deficiency anemia, unspecified: Secondary | ICD-10-CM

## 2023-04-12 DIAGNOSIS — R5383 Other fatigue: Secondary | ICD-10-CM

## 2023-04-12 MED ORDER — COLESTIPOL HCL 1 G PO TABS: 1.0000 g | ORAL_TABLET | Freq: Two times a day (BID) | ORAL | 11 refills | Status: DC

## 2023-04-12 NOTE — Patient Instructions (Signed)
Stop taking propranolol.   If your diarrhea continues then please start colestipol 1 g daily and can increase to 2 g daily of needed.   Continue omeprazole and iron daily.  _______________________________________________________  If your blood pressure at your visit was 140/90 or greater, please contact your primary care physician to follow up on this.  _______________________________________________________  If you are age 74 or older, your body mass index should be between 23-30. Your Body mass index is 32.17 kg/m. If this is out of the aforementioned range listed, please consider follow up with your Primary Care Provider.  If you are age 24 or younger, your body mass index should be between 19-25. Your Body mass index is 32.17 kg/m. If this is out of the aformentioned range listed, please consider follow up with your Primary Care Provider.   ________________________________________________________  The [place] [place] [facility] providers would like to encourage you to use [facility] to communicate with providers for non-urgent requests or questions.  Due to long hold times on the telephone, sending your provider a message by [facility] may be a faster and more efficient way to get a response.  Please allow 48 business hours for a response.  Please remember that this is for non-urgent requests.  _______________________________________________________

## 2023-04-12 NOTE — Progress Notes (Signed)
Subjective:    Patient ID: Theresa Eaton, female    DOB: 03/09/1951, 72 y.o.   MRN: [id number]  HPI Aubyn Fehring is a 72 year old female with a history of iron deficiency anemia, diet-controlled diabetes, hyperlipidemia, osteopenia, GERD and migraines who is here for follow-up.  She was last here in January for upper endoscopy and colonoscopy to evaluate IDA.  She also had a video capsule endoscopy.  EGD and colonoscopy were normal.  Biopsies from the stomach and small bowel were normal. Video capsule endoscopy was normal  She has been on oral iron and tolerating this well. She had recent blood work.  An ongoing issue but also when she reports she really has not mentioned his chronic diarrhea.  She feels that this is possibly a propranolol side effect.  She takes propranolol and primidone for essential tremor.  She describes her stools as "all liquid" 1-3 times a day.  Can be urgent.  Almost always in the morning only.  Does not wake her from sleep.  No abdominal pain associated.  Been going on possibly even more than a year.  Stable weight.  Diet is good.  She does have significant fatigue  She continues omeprazole 20 mg daily without heartburn.   Review of Systems As per HPI, otherwise negative  Current Medications, Allergies, Past Medical History, Past Surgical History, Family History and Social History were reviewed in [employer] record.    Objective:   Physical Exam BP 124/72   Pulse (!) 56   Ht 5\' 7"  (1.702 m)   Wt 205 lb 6.4 oz (93.2 kg)   BMI 32.17 kg/m  Gen: awake, alert, NAD HEENT: anicteric  Neuro: nonfocal     Latest Ref Rng & Units 04/07/2023    8:49 AM 09/29/2022    9:13 AM 07/30/2022    8:36 AM  CBC  WBC 4.0 - 10.5 K/uL 4.9  4.8  5.1   Hemoglobin 12.0 - 15.0 g/dL 02.7  25.3  66.4   Hematocrit 36.0 - 46.0 % 39.1  35.7  34.9   Platelets 150.0 - 400.0 K/uL 228.0  263.0  265.0    Iron/TIBC/Ferritin/ %Sat    Component Value  Date/Time   IRON 95 04/07/2023 0849   TIBC 260.4 04/07/2023 0849   FERRITIN 39.0 04/07/2023 0849   IRONPCTSAT 36.5 04/07/2023 0849        Assessment & Plan:  72 year old female with a history of iron deficiency anemia, diet-controlled diabetes, hyperlipidemia, osteopenia, GERD and migraines who is here for follow-up.   IDA --no GI source for blood loss.  Hemoglobin and iron studies have improved with oral iron. -- Continue oral iron -- Repeat ferritin, IBC panel and CBC in 6 months  2.  GERD --symptoms well-controlled omeprazole.  Continue omeprazole 20 mg daily  3.  Chronic diarrhea --this was not reported prior to colonoscopy and random biopsies were not performed.  Could potentially be a side effect to her medication. --She will do a trial without propranolol --If no improvement begin colestipol 1 g at bedtime, can increase to 2 g if needed.  Separate this med by at least 2 hours from all other medications --I asked her to email me in 2 to 3 weeks to let me know if symptoms have improved  4.  Fatigue --iron studies are better.  This may be a propranolol side effect.  I suggest that she stop this medication to see if this symptom improves.  She can further discuss  this with Ihor Austin her neurology nurse practitioner.  30 minutes total spent today including patient facing time, coordination of care, reviewing medical history/procedures/pertinent radiology studies, and documentation of the encounter.

## 2023-04-13 ENCOUNTER — Other Ambulatory Visit: Payer: Self-pay

## 2023-04-13 DIAGNOSIS — D509 Iron deficiency anemia, unspecified: Secondary | ICD-10-CM

## 2023-06-02 ENCOUNTER — Ambulatory Visit: Payer: Medicare Other | Admitting: Adult Health

## 2023-06-20 ENCOUNTER — Encounter: Payer: Self-pay | Admitting: Adult Health

## 2023-06-20 ENCOUNTER — Ambulatory Visit: Payer: Medicare Other | Admitting: Adult Health

## 2023-06-20 VITALS — BP 129/80 | HR 70 | Ht 67.0 in | Wt 204.0 lb

## 2023-06-20 DIAGNOSIS — M25642 Stiffness of left hand, not elsewhere classified: Secondary | ICD-10-CM

## 2023-06-20 DIAGNOSIS — M25641 Stiffness of right hand, not elsewhere classified: Secondary | ICD-10-CM | POA: Diagnosis not present

## 2023-06-20 DIAGNOSIS — G250 Essential tremor: Secondary | ICD-10-CM | POA: Diagnosis not present

## 2023-06-21 ENCOUNTER — Encounter: Payer: Self-pay | Admitting: Adult Health

## 2023-06-21 MED ORDER — PRIMIDONE 50 MG PO TABS: ORAL_TABLET | ORAL | 5 refills | Status: DC

## 2023-06-21 NOTE — Patient Instructions (Addendum)
Recommend continuing primidone 50 mg every morning and increasing evening dose to 75mg  (1.5 tabs) for 1 week then increase to 100mg  (2 tabs). Please call with any difficulty tolerating  Referral placed to neuro rehab OT - you can contact them at [phone] to schedule appointment    Follow up in 6 months or call earlier if needed

## 2023-06-21 NOTE — Progress Notes (Signed)
[facility] [address] [place]. [place] [address] [phone]       OFFICE FOLLOW UP NOTE  Ms. CYBILL BRINKMANN Date of Birth:  July 05, 1951 Medical Record Number:  [id number]    Primary neurologist: Dr. Frances Furbish Reason for visit: Essential tremor    SUBJECTIVE:   CHIEF COMPLAINT:  Chief Complaint  Patient presents with   Follow-up    Rm 3, here alone Pt is following up on essential tremor. Pt states tremor is a little worse without the propranolol, however her heart rate got better. Pt states she stopped taking propranolol in June per GI. Pt states she would like to try OT see if it helps with her tremors.     HPI:    Update 06/20/2023 JM: Patient returns for follow-up visit.  Reports discontinuing propranolol back in June per GI recommendations as they were concerned this was causing persistent diarrhea, did not notice improvement of diarrhea after stopping but did note improvement of heart rate.  She remains on primidone 50 mg twice daily, denies side effects. Reports tremors have slightly worsened since stopping propranolol and continues to have hand stiffness.  Previously discussed working with OT and now interested in pursuing as tremors and stiffness can interfere with writing.      History provided for reference purposes only Update 11/24/2022 JM: Patient returns for 28-month follow-up regarding essential tremor.  At prior visit, recommended continuation of propranolol 10 mg twice daily and increase primidone gradually to 50 mg twice daily.  She reports improvement of her tremor since increasing primidone dosage, tolerating well. She also remains on propranolol.  Unfortunately, she has not been able to return back to playing the viola more so due to hand stiffness and agility. Does participate in [employer] sne[employer] classes which does offer fine motor exercises which she participates with. Notes improvement of her writing since increased dose. Tremors do not  interfere with any other activity or functioning. Denies tremor in neck/voice.  Consult visit 08/11/2022 Dr. Frances Furbish: Ms. Vanhoosier is a 72 year old female with an underlying medical history of reflux disease, hyperlipidemia, osteopenia, diabetes, arthritis, edema of both lower extremities, and obesity, who reports a bilateral upper extremity tremor for the past 18 mo.  I reviewed your office note from 08/06/2022.  She has been on Mysoline 50 mg strength and also takes propranolol 10 mg twice daily.  She has been on these medications for the past several months, for started on low-dose propranolol twice daily.  She then started Mysoline 25 mg at night and then increased it to the current dose of 50 mg at night.  She tolerates the medication, denies any sleepiness from it.  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  Her father had hand tremors.  She has 2 sisters, neither 1 with tremors, her 3 children have no tremors.  She has limited her caffeine, generally drinks mostly decaf coffee.  She drinks alcohol in the form of wine, 1 glass about 3 days out of the week.  She has noticed a connection to stress exacerbating her tremors.  She has been sleeping fairly well.  She does have a history of snoring.  She does have sleep disruption from nocturia about 2-3 times, depending on how bad her leg swelling is.  She has bilateral leg edema.  She is a non-smoker.  She tries to hydrate well with  water. She had recent blood work through your office on 08/06/2022 and I reviewed the results: Vitamin B-12 was 551, ferritin low at 10, A1c was 6.4.  On 07/30/2022 her TSH was 4.18, vitamin D borderline at 31.9, total cholesterol 222, triglycerides 128, HDL 50.8, LDL elevated at 130.    ROS:   14 system review of systems performed and negative with exception of those listed in HPI  PMH:  Past Medical History:   Diagnosis Date   Arthritis    bursitis in lt. and rt.   Chicken pox    Diabetes mellitus without complication (HCC)    history of no longer since 2015   GERD (gastroesophageal reflux disease)    Hyperlipidemia    in the past no longer has   Osteopenia    started on fosamax 2013   Tremor     PSH:  Past Surgical History:  Procedure Laterality Date   COLONOSCOPY  10/25/2005   [place]/Normal     COLONOSCOPY WITH ESOPHAGOGASTRODUODENOSCOPY (EGD)  11/12/2022   Laser closure of greater saphenous vein, Left  2015   TUBAL LIGATION      Social History:  Social History   Socioeconomic History   Marital status: Married    Spouse name: Not on file   Number of children: Not on file   Years of education: Not on file   Highest education level: Not on file  Occupational History   Not on file  Tobacco Use   Smoking status: Never   Smokeless tobacco: Never  Vaping Use   Vaping status: Never Used  Substance and Sexual Activity   Alcohol use: Yes    Alcohol/week: 4.0 standard drinks of alcohol    Types: 4 Glasses of wine per week    Comment: 1 glass of wine with a meal 4x/wk   Drug use: Never   Sexual activity: Not Currently    Birth control/protection: Post-menopausal  Other Topics Concern   Not on file  Social History Narrative   MDIV, Manfred Shirts priest   Married 1973   3 kids- her daughter is a family medicine MD (med school at [employer]).   Social Determinants of Health   Financial Resource Strain: Low Risk  (04/15/2021)   Overall Financial Resource Strain (CARDIA)    Difficulty of Paying Living Expenses: Not hard at all  Food Insecurity: No Food Insecurity (04/15/2021)   Hunger Vital Sign    Worried About Running Out of Food in the Last Year: Never true    Ran Out of Food in the Last Year: Never true  Transportation Needs: No Transportation Needs (04/15/2021)   PRAPARE - Administrator, Civil Service (Medical): No    Lack of Transportation (Non-Medical): No   Physical Activity: Sufficiently Active (04/15/2021)   Exercise Vital Sign    Days of Exercise per Week: 7 days    Minutes of Exercise per Session: 30 min  Stress: No Stress Concern Present (04/15/2021)   [employer] of Occupational Health - Occupational Stress Questionnaire    Feeling of Stress : Not at all  Social Connections: Not on file  Intimate Partner Violence: Not At Risk (04/15/2021)   Humiliation, Afraid, Rape, and Kick questionnaire    Fear of Current or Ex-Partner: No    Emotionally Abused: No    Physically Abused: No    Sexually Abused: No    Family History:  Family History  Problem Relation Age of Onset   Hyperlipidemia Mother    Dementia  Mother    Cancer Father        prostate   Osteoporosis Father    Tremor Father    Cancer Sister        breast   Diabetes Sister    Breast cancer Sister    Osteoporosis Brother    Osteoporosis Maternal Aunt    Diabetes Maternal Grandmother    Heart disease Maternal Grandfather    Heart disease Paternal Grandfather    Colon cancer Neg Hx    Colon polyps Neg Hx    Esophageal cancer Neg Hx    Stomach cancer Neg Hx    Rectal cancer Neg Hx    Parkinson's disease Neg Hx     Medications:   Current Outpatient Medications on File Prior to Visit  Medication Sig Dispense Refill   Cholecalciferol (VITAMIN D3) 50 MCG (2000 UT) capsule Take 1 capsule (2,000 Units total) by mouth daily.     colestipol (COLESTID) 1 g tablet Take 1 tablet (1 g total) by mouth 2 (two) times daily. 60 tablet 11   Iron, Ferrous Sulfate, 325 (65 Fe) MG TABS Take 325 mg by mouth daily.     Multiple Vitamins-Minerals (CENTRUM SILVER ADULT 50+ PO) Take 1 tablet by mouth daily.     omeprazole (PRILOSEC) 20 MG capsule Take 1 capsule (20 mg total) by mouth daily. 90 capsule 3   primidone (MYSOLINE) 50 MG tablet Take 1 tablet (50 mg total) by mouth in the morning and at bedtime. 180 tablet 3   No current facility-administered medications on file prior to  visit.    Allergies:   Allergies  Allergen Reactions   Statins     Leg weakness      OBJECTIVE:  Physical Exam  Vitals:   06/20/23 1545  BP: 129/80  Pulse: 70  Weight: 204 lb (92.5 kg)  Height: 5\' 7"  (1.702 m)   Body mass index is 31.95 kg/m. No results found.   General: well developed, well nourished, very pleasant elderly Caucasian female, seated, in no evident distress Head: head normocephalic and atraumatic.   Neck: supple with no carotid or supraclavicular bruits Cardiovascular: regular rate and rhythm, no murmurs Musculoskeletal: no deformity Skin:  no rash/petichiae Vascular:  Normal pulses all extremities   Neurologic Exam Mental Status: Awake and fully alert.  Fluent speech and language.  Oriented to place and time. Recent and remote memory intact. Attention span, concentration and fund of knowledge appropriate. Mood and affect appropriate.  Cranial Nerves: Pupils equal, briskly reactive to light. Extraocular movements full without nystagmus. Visual fields full to confrontation. Hearing intact. Facial sensation intact. Face, tongue, palate moves normally and symmetrically.  Motor: Normal bulk and tone. Normal strength in all tested extremity muscles. Unable to appreciate action/postural tremor on exam today.  No resting tremor.  No lower extremity tremor. Sensory.: intact to touch , pinprick , position and vibratory sensation.  Coordination: Rapid alternating movements normal in all extremities. Finger-to-nose and heel-to-shin performed accurately bilaterally. Gait and Station: Arises from chair without difficulty. Stance is normal. Gait demonstrates normal stride length and balance without use of AD.  Reflexes: 1+ and symmetric. Toes downgoing.         ASSESSMENT/PLAN: MAECIE KILZER is a 72 y.o. year old female    1.  Essential tremor -Currently on primidone 50mg  BID, reports worsening tremor since stopping propranolol, recommend continuing 50 mg AM  and increasing evening dose of primidone to 75mg  for 1 week (1.5 tabs) then increase to  100mg  (2 tabs) -discussed potential side effects and advised to call with any difficulty tolerating -Intolerant to propranolol (lowered HR, fatigue) -Referral placed to OT for hand stiffness and tremor    Follow up in 6 months or call earlier if needed    CC:  PCP: Joaquim Nam, MD    I spent 25 minutes of face-to-face and non-face-to-face time with patient.  This included previsit chart review, lab review, study review, order entry, electronic health record documentation, patient education regarding above diagnoses and treatment plan and answered all other questions to patient satisfaction   Ihor Austin, [facility]  [facility] [address] [place], [place] [address]  Phone [phone] Fax [phone] Note: This document was prepared with digital dictation and possible smart phrase technology. Any transcriptional errors that result from this process are unintentional.

## 2023-06-23 ENCOUNTER — Other Ambulatory Visit: Payer: Self-pay

## 2023-06-23 ENCOUNTER — Ambulatory Visit: Payer: Medicare Other | Attending: Adult Health | Admitting: Occupational Therapy

## 2023-06-23 DIAGNOSIS — R251 Tremor, unspecified: Secondary | ICD-10-CM | POA: Diagnosis not present

## 2023-06-23 DIAGNOSIS — M25641 Stiffness of right hand, not elsewhere classified: Secondary | ICD-10-CM | POA: Diagnosis not present

## 2023-06-23 DIAGNOSIS — M6281 Muscle weakness (generalized): Secondary | ICD-10-CM | POA: Insufficient documentation

## 2023-06-23 DIAGNOSIS — R278 Other lack of coordination: Secondary | ICD-10-CM | POA: Diagnosis present

## 2023-06-23 DIAGNOSIS — G250 Essential tremor: Secondary | ICD-10-CM | POA: Diagnosis not present

## 2023-06-23 DIAGNOSIS — M25642 Stiffness of left hand, not elsewhere classified: Secondary | ICD-10-CM | POA: Diagnosis not present

## 2023-06-23 NOTE — Therapy (Signed)
OUTPATIENT OCCUPATIONAL THERAPY NEURO EVALUATION  Patient Name: Theresa Eaton MRN: [id number] DOB:08-25-1951, 72 y.o., female Today's Date: 06/23/2023  PCP: Joaquim Nam, MD REFERRING PROVIDER: Ihor Austin, NP  END OF SESSION:  OT End of Session - 06/23/23 1453     Visit Number 1    Number of Visits 7    Date for OT Re-Evaluation 08/19/23    Authorization Type [employer] Medicare    OT Start Time 1448    OT Stop Time 1532    OT Time Calculation (min) 44 min             Past Medical History:  Diagnosis Date   Arthritis    bursitis in lt. and rt.   Chicken pox    Diabetes mellitus without complication (HCC)    history of no longer since 2015   GERD (gastroesophageal reflux disease)    Hyperlipidemia    in the past no longer has   Osteopenia    started on fosamax 2013   Tremor    Past Surgical History:  Procedure Laterality Date   COLONOSCOPY  10/25/2005   /Normal     COLONOSCOPY WITH ESOPHAGOGASTRODUODENOSCOPY (EGD)  11/12/2022   Laser closure of greater saphenous vein, Left  2015   TUBAL LIGATION     Patient Active Problem List   Diagnosis Date Noted   Anemia 08/08/2022   GERD (gastroesophageal reflux disease) 08/19/2021   Tremor 03/08/2021   Sciatica 09/13/2019   Healthcare maintenance 09/05/2018   Advance care planning 09/18/2014   Medicare annual wellness visit, subsequent 03/09/2013   Hyperglycemia 11/07/2012   Hyperlipidemia 11/02/2012   Osteopenia 11/02/2012    ONSET DATE: referral 06/21/23 (symptoms ~2 years)  REFERRING DIAG: G25.0 (ICD-10-CM) - Essential tremor M25.641,M25.642 (ICD-10-CM) - Stiffness of joints of both hands  THERAPY DIAG:  Tremor  Muscle weakness (generalized)  Other lack of coordination  Rationale for Evaluation and Treatment: Rehabilitation  SUBJECTIVE:   SUBJECTIVE STATEMENT: Pt reports that she was noticing increased tremors and difficulty with handwriting.  Noted last June, increased difficulty with BUE  and impacting her ability to play the viola.  They trialed increased meds still with minimal impact.  Pt states tremor is a little worse without the propranolol, however her heart rate got better. Pt states she stopped taking propranolol in June per GI. Pt states she would like to try OT see if it helps with her tremors. Pt reports that she has come to terms with loss of playing viola but would like to preserve her handwriting.   Pt accompanied by: self  PERTINENT HISTORY: reflux disease, hyperlipidemia, osteopenia, diabetes, arthritis, edema of both lower extremities, and obesity, who reports a bilateral upper extremity tremor  PRECAUTIONS: Fall  WEIGHT BEARING RESTRICTIONS: No  PAIN:  Are you having pain? No  FALLS: Has patient fallen in last 6 months? No  LIVING ENVIRONMENT: Lives with: lives with their spouse Lives in: House/apartment Stairs: Yes: External: 4 step in front, 4 steps in back steps; on left going up Has following equipment at home:  hand held shower head  PLOF: Independent and Independent with basic ADLs  PATIENT GOALS: to preserve handwriting  OBJECTIVE:   HAND DOMINANCE: Left  ADLs: Overall ADLs: Mod I to independent overall Eating: difficulty with opening certain types of jars Grooming: reports "conscious" of tremors with applying mascara  IADLs: Light housekeeping: Mod I Meal Prep: Mod I Community mobility: driving Medication management: Mod I Handwriting: 90% legible and Increased time  MOBILITY STATUS: Independent  POSTURE COMMENTS:  No Significant postural limitations  ACTIVITY TOLERANCE: Activity tolerance: WFL for tasks assessed on eval  FUNCTIONAL OUTCOME MEASURES: Quick Dash: 13.6 disability  UPPER EXTREMITY ROM:  WFL bilaterally   UPPER EXTREMITY MMT:   grossly 4+ to 5/5 overall, except R elbow extension 4-5   HAND FUNCTION: Grip strength: Right: 44 lbs; Left: 39 lbs  COORDINATION: Finger Nose Finger test: WFL, mild decreased  speed,  9 Hole Peg test: Right: 23.34 sec; Left: 23.38 sec Box and Blocks:  Right 59 blocks, Left 52 blocks Tremors: action, most noted during handwriting and carrying/holding wine chalice PPT #2 (self-feeding): 09.31 sec - mild tremor noted with scooping with spoon PPT #1: (handwriting) 17.41 sec (whales live in a blue ocean).  SENSATION: WFL  COGNITION: Overall cognitive status: Within functional limits for tasks assessed   OBSERVATIONS: Pt with tremors in ring fingers bilaterally at rest, tremor most notable with handwriting.   TODAY'S TREATMENT:                                                                                  DATE:  06/23/23 Educated on strategies to reduce action tremor.  Discussing wrist weights, weighted pens/utensil, an bracing arm against torso and/or support with opposite arm when possible.  Provided with handout from OT toolkit with recommendation to attempt various aspects and return to discuss at next session.  PATIENT EDUCATION: Education details: Educated on role and purpose of OT as well as potential interventions and goals for therapy based on initial evaluation findings. Person educated: Patient Education method: Explanation and Handouts Education comprehension: verbalized understanding and needs further education  HOME EXERCISE PROGRAM: TBD   GOALS: Goals reviewed with patient? Yes  SHORT TERM GOALS: Target date: 07/22/23  Pt will be independent with HEP for improved fine motor control as needed for ADLs. Baseline: Goal status: INITIAL  2.  Pt will verbalize understanding of improved body mechanics and use of AE for improved success with handwriting and self-feeding tasks.   Baseline:  Goal status: INITIAL  3.  Pt will demonstrate improved UE functional use for ADLs as evidenced by increasing box/ blocks score by 5 blocks and improved motor control with dominant LUE. Baseline: R: 62, L: 52  Goal status: INITIAL   LONG TERM GOALS:  Target date: 08/19/23  Pt will write a paragraph for 5 mins with 95% legibility and no reports of fatigue with adaptive equipment or strategy as needed.  Baseline:  Goal status: INITIAL  2.  Pt will demonstrate improved motor control to be able to carry cup/plate across room and maintain steady grasp when serving communion. Baseline:  Goal status: INITIAL  3.  Pt will verbalize understanding of adaptive techniques/strategies to decrease tremors during ADLs/IADLs  Baseline:  Goal status: INITIAL  ASSESSMENT:  CLINICAL IMPRESSION: Patient is a 72 y.o. female who was seen today for occupational therapy evaluation for essential tremor, most notable with handwriting. 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 PMHx of reflux disease, hyperlipidemia, osteopenia, diabetes, arthritis, edema of both lower extremities, and obesity,  bilateral upper  extremity tremor. Pt will benefit from skilled occupational therapy services to address strength and coordination, ROM,GM/FM control, safety awareness, introduction of compensatory strategies/AE prn, and implementation of an HEP to improve participation and safety during ADLs and IADLs.   PERFORMANCE DEFICITS: in functional skills including ADLs, IADLs, coordination, strength, flexibility, Gross motor control, and UE functional use and psychosocial skills including coping strategies and environmental adaptation.   IMPAIRMENTS: are limiting patient from ADLs, IADLs, work, and social participation.   CO-MORBIDITIES: may have co-morbidities  that affects occupational performance. Patient will benefit from skilled OT to address above impairments and improve overall function.  MODIFICATION OR ASSISTANCE TO COMPLETE EVALUATION: No modification of tasks or assist necessary to complete an evaluation.  OT OCCUPATIONAL PROFILE AND HISTORY: Problem focused assessment: Including review of records  relating to presenting problem.  CLINICAL DECISION MAKING: LOW - limited treatment options, no task modification necessary  REHAB POTENTIAL: Good  EVALUATION COMPLEXITY: Low    PLAN:  OT FREQUENCY: 1x/week  OT DURATION: 8 weeks (6 visits over 8 weeks due to upcoming trip)  PLANNED INTERVENTIONS: self care/ADL training, therapeutic exercise, therapeutic activity, neuromuscular re-education, passive range of motion, functional mobility training, compression bandaging, moist heat, cryotherapy, patient/family education, psychosocial skills training, coping strategies training, and DME and/or AE instructions  RECOMMENDED OTHER SERVICES: NA  CONSULTED AND AGREED WITH PLAN OF CARE: Patient  PLAN FOR NEXT SESSION: pour water from cup to cup, review tremor strategies - trial variety of writing utensils, theraputty   Nickolai Rinks, OTR/L 06/23/2023, 4:02 PM

## 2023-07-01 ENCOUNTER — Ambulatory Visit: Payer: Medicare Other | Attending: Adult Health | Admitting: Occupational Therapy

## 2023-07-01 DIAGNOSIS — R278 Other lack of coordination: Secondary | ICD-10-CM | POA: Insufficient documentation

## 2023-07-01 DIAGNOSIS — M6281 Muscle weakness (generalized): Secondary | ICD-10-CM | POA: Insufficient documentation

## 2023-07-01 DIAGNOSIS — R251 Tremor, unspecified: Secondary | ICD-10-CM | POA: Insufficient documentation

## 2023-07-01 NOTE — Therapy (Deleted)
OUTPATIENT OCCUPATIONAL THERAPY NEURO EVALUATION  Patient Name: Theresa Eaton MRN: [id number] DOB:[birth year], 72 y.o., female Today's Date: 07/01/2023  PCP: Joaquim Nam, MD REFERRING PROVIDER: Ihor Austin, NP  END OF SESSION:    Past Medical History:  Diagnosis Date   Arthritis    bursitis in lt. and rt.   Chicken pox    Diabetes mellitus without complication (HCC)    history of no longer since 2015   GERD (gastroesophageal reflux disease)    Hyperlipidemia    in the past no longer has   Osteopenia    started on fosamax 2013   Tremor    Past Surgical History:  Procedure Laterality Date   COLONOSCOPY  10/25/2005   [place]/Normal     COLONOSCOPY WITH ESOPHAGOGASTRODUODENOSCOPY (EGD)  11/12/2022   Laser closure of greater saphenous vein, Left  2015   TUBAL LIGATION     Patient Active Problem List   Diagnosis Date Noted   Anemia 08/08/2022   GERD (gastroesophageal reflux disease) 08/19/2021   Tremor 03/08/2021   Sciatica 09/13/2019   Healthcare maintenance 09/05/2018   Advance care planning 09/18/2014   Medicare annual wellness visit, subsequent 03/09/2013   Hyperglycemia 11/07/2012   Hyperlipidemia 11/02/2012   Osteopenia 11/02/2012    ONSET DATE: referral 06/21/23 (symptoms ~2 years)  REFERRING DIAG: G25.0 (ICD-10-CM) - Essential tremor M25.641,M25.642 (ICD-10-CM) - Stiffness of joints of both hands  THERAPY DIAG:  No diagnosis found.  Rationale for Evaluation and Treatment: Rehabilitation  SUBJECTIVE:   SUBJECTIVE STATEMENT: Pt reports that she was noticing increased tremors and difficulty with handwriting.  Noted last June, increased difficulty with BUE and impacting her ability to play the viola.  They trialed increased meds still with minimal impact.  Pt states tremor is a little worse without the propranolol, however her heart rate got better. Pt states she stopped taking propranolol in June per GI. Pt states she would like to try OT see if it  helps with her tremors. Pt reports that she has come to terms with loss of playing viola but would like to preserve her handwriting.   Pt accompanied by: self  PERTINENT HISTORY: reflux disease, hyperlipidemia, osteopenia, diabetes, arthritis, edema of both lower extremities, and obesity, who reports a bilateral upper extremity tremor  PRECAUTIONS: Fall  WEIGHT BEARING RESTRICTIONS: No  PAIN:  Are you having pain? No  FALLS: Has patient fallen in last 6 months? No  LIVING ENVIRONMENT: Lives with: lives with their spouse Lives in: House/apartment Stairs: Yes: External: 4 step in front, 4 steps in back steps; on left going up Has following equipment at home:  hand held shower head  PLOF: Independent and Independent with basic ADLs  PATIENT GOALS: to preserve handwriting  OBJECTIVE:   HAND DOMINANCE: Left  ADLs: Overall ADLs: Mod I to independent overall Eating: difficulty with opening certain types of jars Grooming: reports "conscious" of tremors with applying mascara  IADLs: Light housekeeping: Mod I Meal Prep: Mod I Community mobility: driving Medication management: Mod I Handwriting: 90% legible and Increased time  MOBILITY STATUS: Independent  POSTURE COMMENTS:  No Significant postural limitations  ACTIVITY TOLERANCE: Activity tolerance: WFL for tasks assessed on eval  FUNCTIONAL OUTCOME MEASURES: Quick Dash: 13.6 disability  UPPER EXTREMITY ROM:  WFL bilaterally   UPPER EXTREMITY MMT:   grossly 4+ to 5/5 overall, except R elbow extension 4-5   HAND FUNCTION: Grip strength: Right: 44 lbs; Left: 39 lbs  COORDINATION: Finger Nose Finger test: WFL, mild decreased speed,  9 Hole Peg test: Right: 23.34 sec; Left: 23.38 sec Box and Blocks:  Right 59 blocks, Left 52 blocks Tremors: action, most noted during handwriting and carrying/holding wine chalice PPT #2 (self-feeding): 09.31 sec - mild tremor noted with scooping with spoon PPT #1: (handwriting) 17.41  sec (whales live in a blue ocean).  SENSATION: WFL  COGNITION: Overall cognitive status: Within functional limits for tasks assessed   OBSERVATIONS: Pt with tremors in ring fingers bilaterally at rest, tremor most notable with handwriting.   TODAY'S TREATMENT:                                                                                  DATE:  07/01/23 pour water from cup to cup, review tremor strategies - trial variety of writing utensils, theraputty   06/23/23 Educated on strategies to reduce action tremor.  Discussing wrist weights, weighted pens/utensil, an bracing arm against torso and/or support with opposite arm when possible.  Provided with handout from OT toolkit with recommendation to attempt various aspects and return to discuss at next session.  PATIENT EDUCATION: Education details: ongoing condition specific education Person educated: Patient Education method: Chief Technology Officer Education comprehension: verbalized understanding and needs further education  HOME EXERCISE PROGRAM: TBD   GOALS: Goals reviewed with patient? Yes  SHORT TERM GOALS: Target date: 07/22/23  Pt will be independent with HEP for improved fine motor control as needed for ADLs. Baseline: Goal status: IN PROGRESS  2.  Pt will verbalize understanding of improved body mechanics and use of AE for improved success with handwriting and self-feeding tasks.   Baseline:  Goal status: IN PROGRESS  3.  Pt will demonstrate improved UE functional use for ADLs as evidenced by increasing box/ blocks score by 5 blocks and improved motor control with dominant LUE. Baseline: R: 62, L: 52  Goal status: IN PROGRESS   LONG TERM GOALS: Target date: 08/19/23  Pt will write a paragraph for 5 mins with 95% legibility and no reports of fatigue with adaptive equipment or strategy as needed.  Baseline:  Goal status: IN PROGRESS  2.  Pt will demonstrate improved motor control to be able to carry  cup/plate across room and maintain steady grasp when serving communion. Baseline:  Goal status: IN PROGRESS  3.  Pt will verbalize understanding of adaptive techniques/strategies to decrease tremors during ADLs/IADLs  Baseline:  Goal status: IN PROGRESS  ASSESSMENT:  CLINICAL IMPRESSION: Patient is a 72 y.o. female who was seen today for occupational therapy evaluation for essential tremor, most notable with handwriting. 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 PMHx of reflux disease, hyperlipidemia, osteopenia, diabetes, arthritis, edema of both lower extremities, and obesity,  bilateral upper extremity tremor. Pt will benefit from skilled occupational therapy services to address strength and coordination, ROM,GM/FM control, safety awareness, introduction of compensatory strategies/AE prn, and implementation of an HEP to improve participation and safety during ADLs and IADLs.   PERFORMANCE DEFICITS: in functional skills including ADLs, IADLs, coordination, strength, flexibility, Gross motor control, and UE functional use and psychosocial skills including coping strategies and environmental adaptation.  IMPAIRMENTS: are limiting patient from ADLs, IADLs, work, and social participation.   CO-MORBIDITIES: may have co-morbidities  that affects occupational performance. Patient will benefit from skilled OT to address above impairments and improve overall function.  MODIFICATION OR ASSISTANCE TO COMPLETE EVALUATION: No modification of tasks or assist necessary to complete an evaluation.  OT OCCUPATIONAL PROFILE AND HISTORY: Problem focused assessment: Including review of records relating to presenting problem.  CLINICAL DECISION MAKING: LOW - limited treatment options, no task modification necessary  REHAB POTENTIAL: Good  EVALUATION COMPLEXITY: Low    PLAN:  OT FREQUENCY: 1x/week  OT DURATION: 8 weeks (6 visits  over 8 weeks due to upcoming trip)  PLANNED INTERVENTIONS: self care/ADL training, therapeutic exercise, therapeutic activity, neuromuscular re-education, passive range of motion, functional mobility training, compression bandaging, moist heat, cryotherapy, patient/family education, psychosocial skills training, coping strategies training, and DME and/or AE instructions  RECOMMENDED OTHER SERVICES: NA  CONSULTED AND AGREED WITH PLAN OF CARE: Patient  PLAN FOR NEXT SESSION: pour water from cup to cup, review tremor strategies - trial variety of writing utensils, theraputty   Lynn Recendiz, OTR/L 07/01/2023, 10:32 AM

## 2023-07-07 IMAGING — DX DG FOOT COMPLETE 3+V*R*
3 series · 3 of 3 positions shown · non-contrast
Comparison: Right ankle radiograph dated 01/16/2022.

CLINICAL DATA: Fall.

EXAM:
RIGHT FOOT COMPLETE - 3+ VIEW

[foot ap]
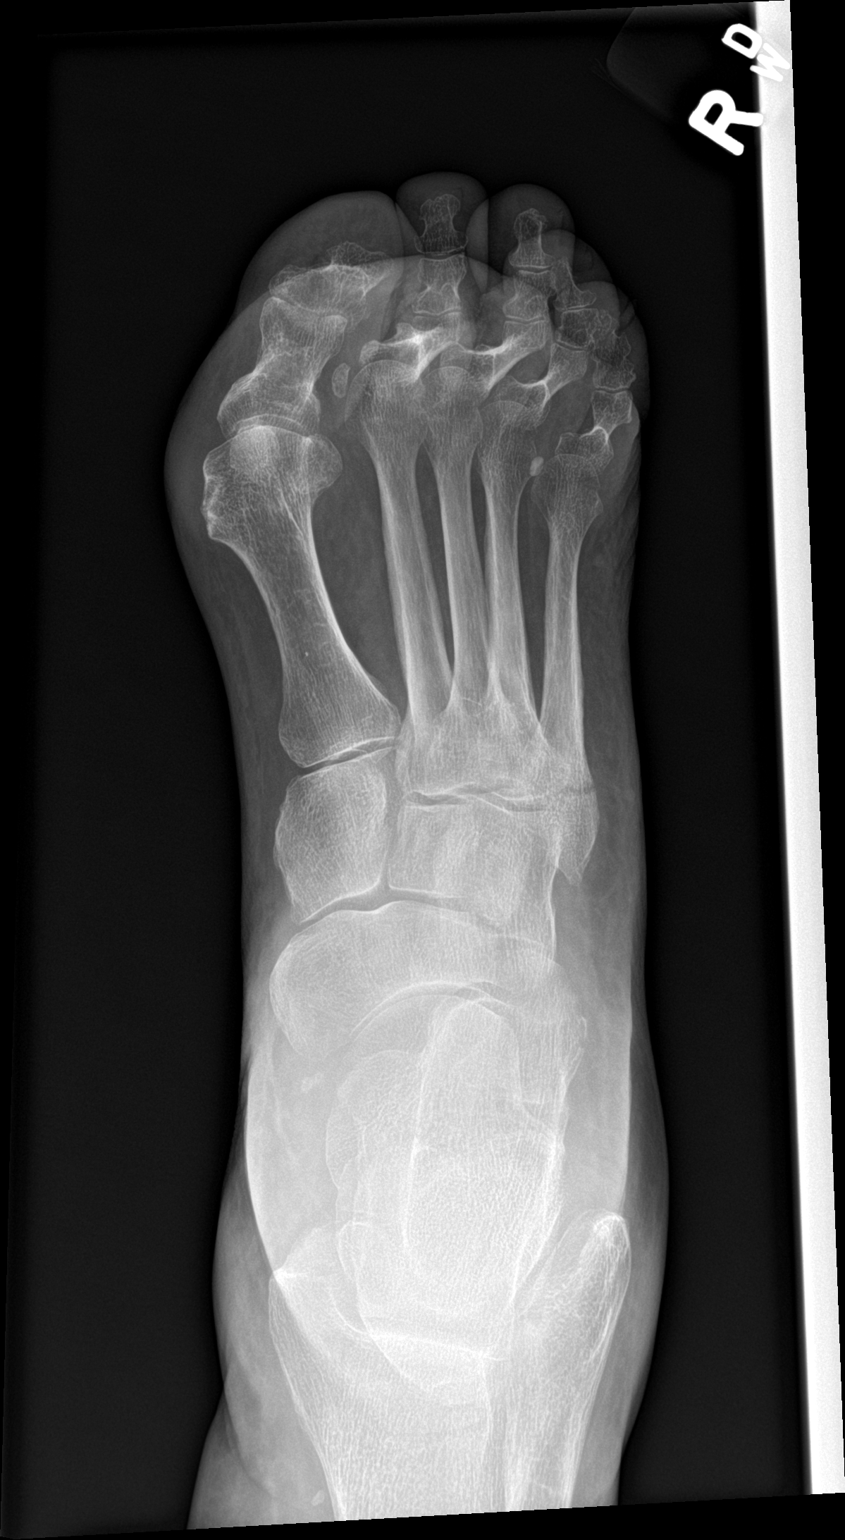

[foot obl]
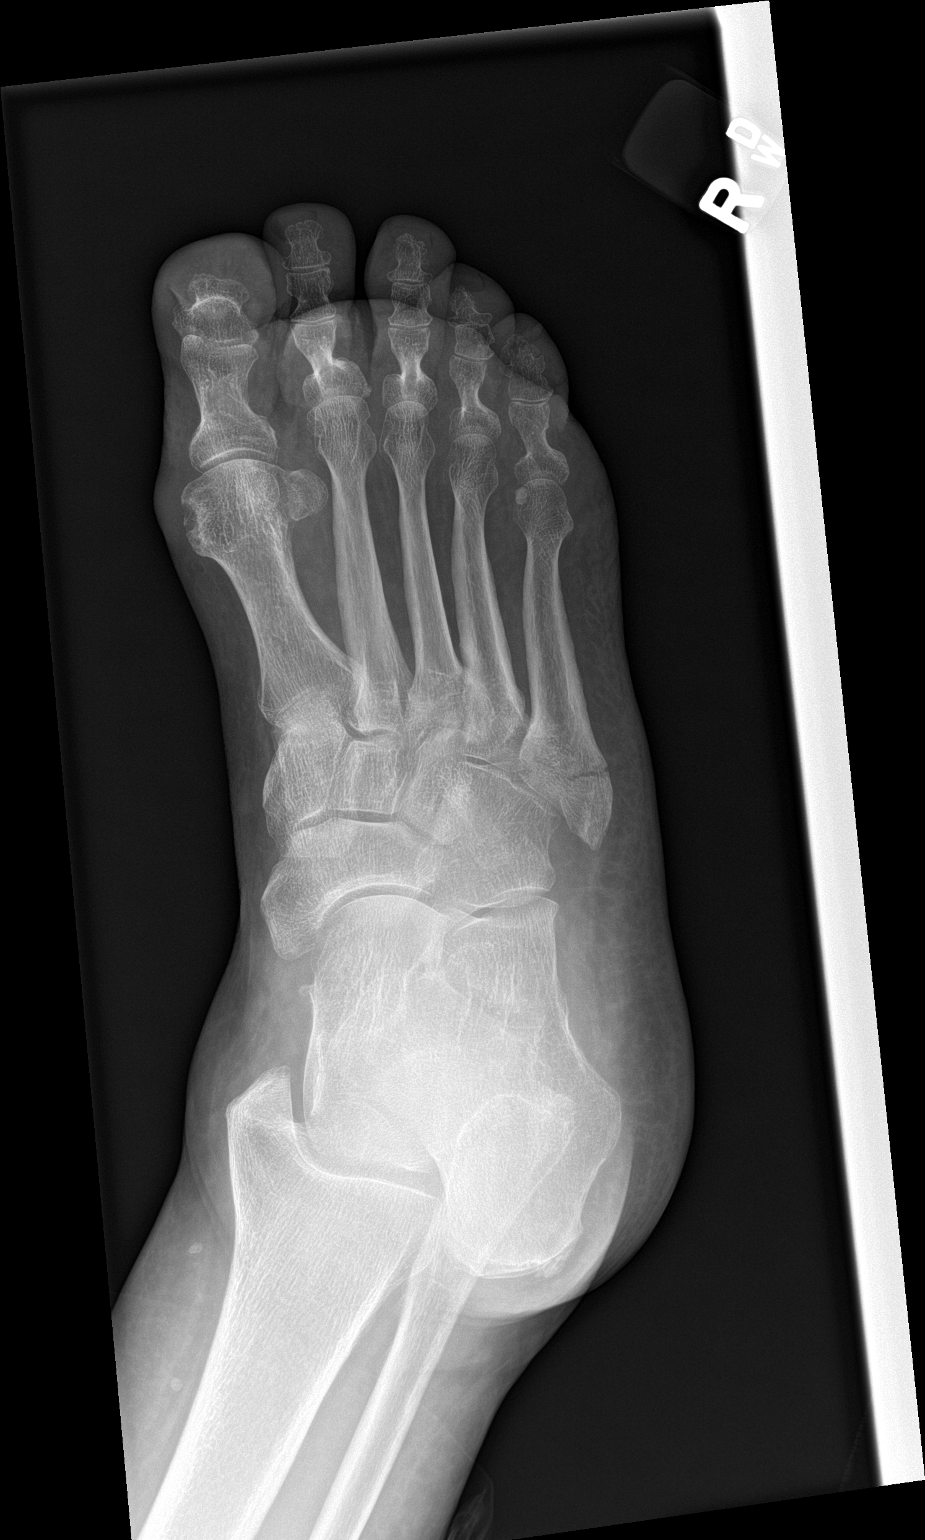

[foot lat]
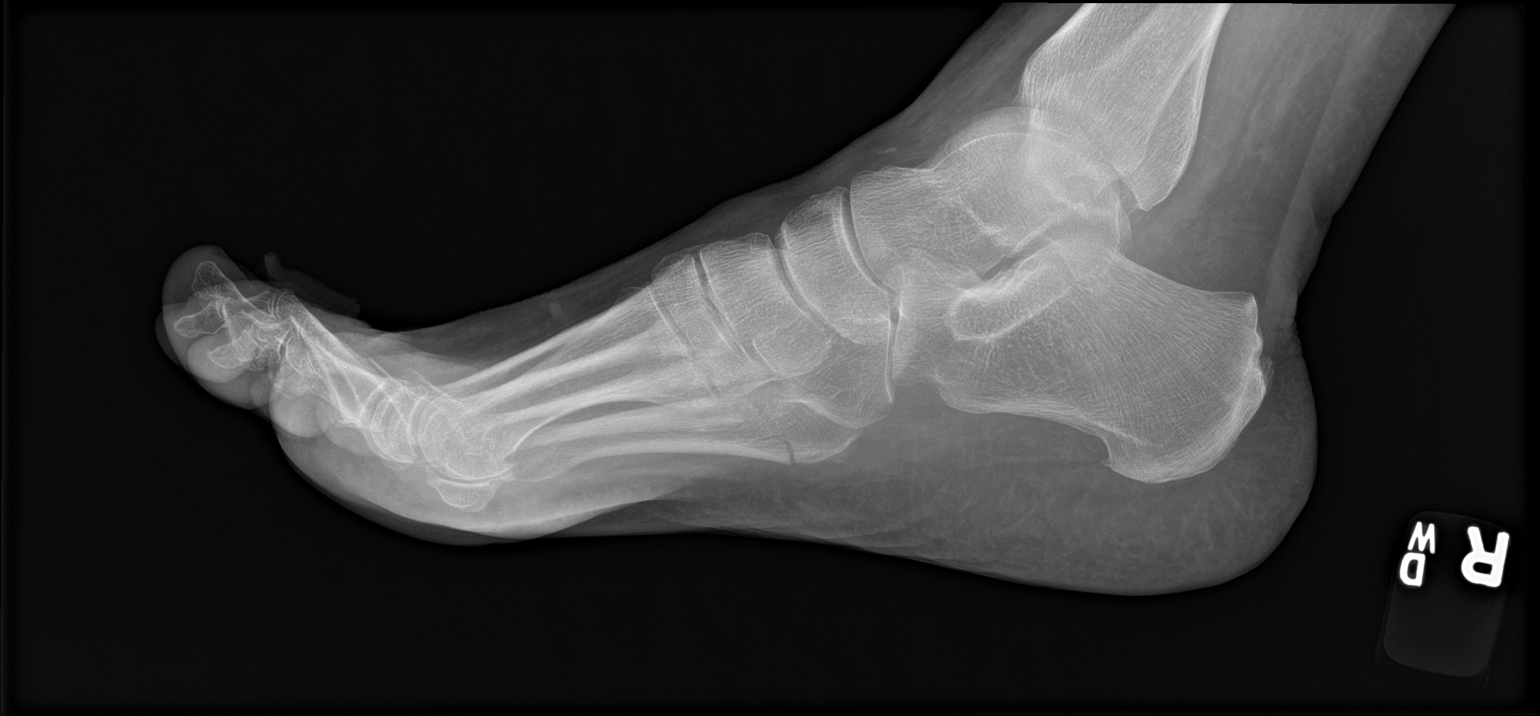

[3 of 3 positions shown; findings below may reference images not displayed]

FINDINGS: Nondisplaced fracture of the base of the fifth metatarsal. Fracture
of the tip of the lateral malleolus seen on the ankle radiograph is
poorly visualized on this foot radiograph. There is no dislocation.
The bones are osteopenic. The soft tissues are grossly unremarkable.
IMPRESSION: Nondisplaced fracture of the base of the fifth metatarsal. No
dislocation.

## 2023-07-07 IMAGING — DX DG ANKLE COMPLETE 3+V*R*
3 series · 3 of 3 positions shown · non-contrast
Comparison: None.

CLINICAL DATA: Right ankle pain following fall, initial encounter

EXAM:
RIGHT ANKLE - COMPLETE 3+ VIEW

[ankle ap]
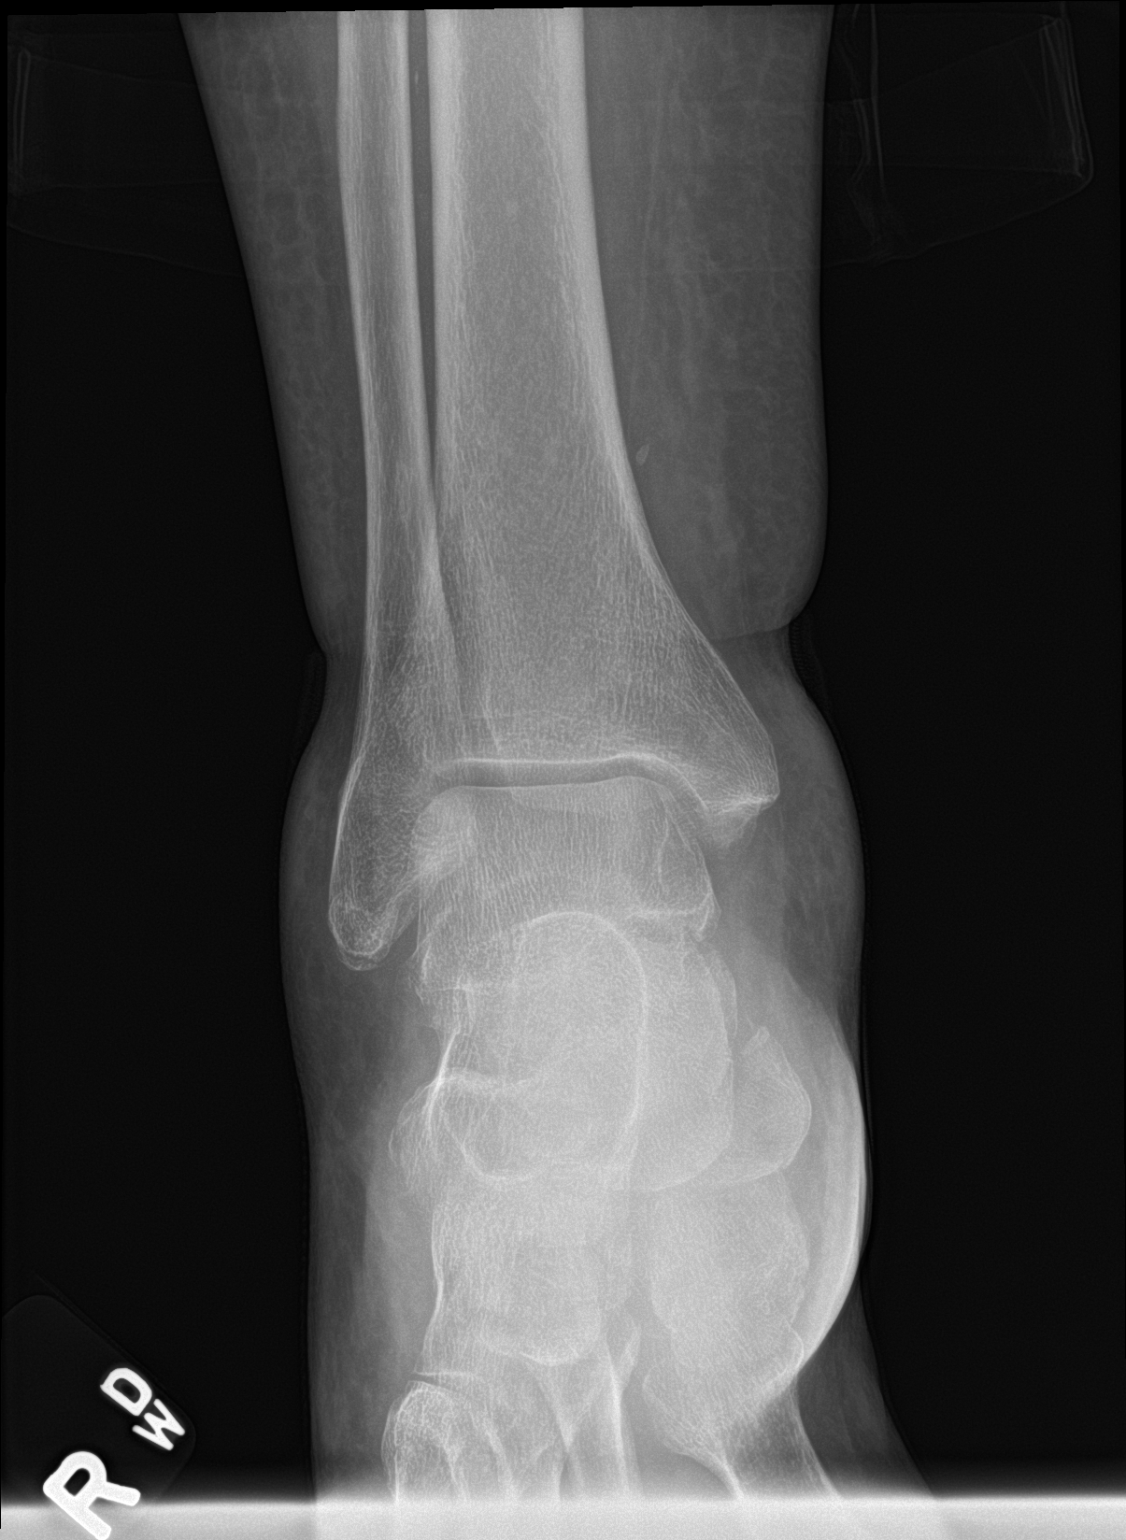

[ankle obl]
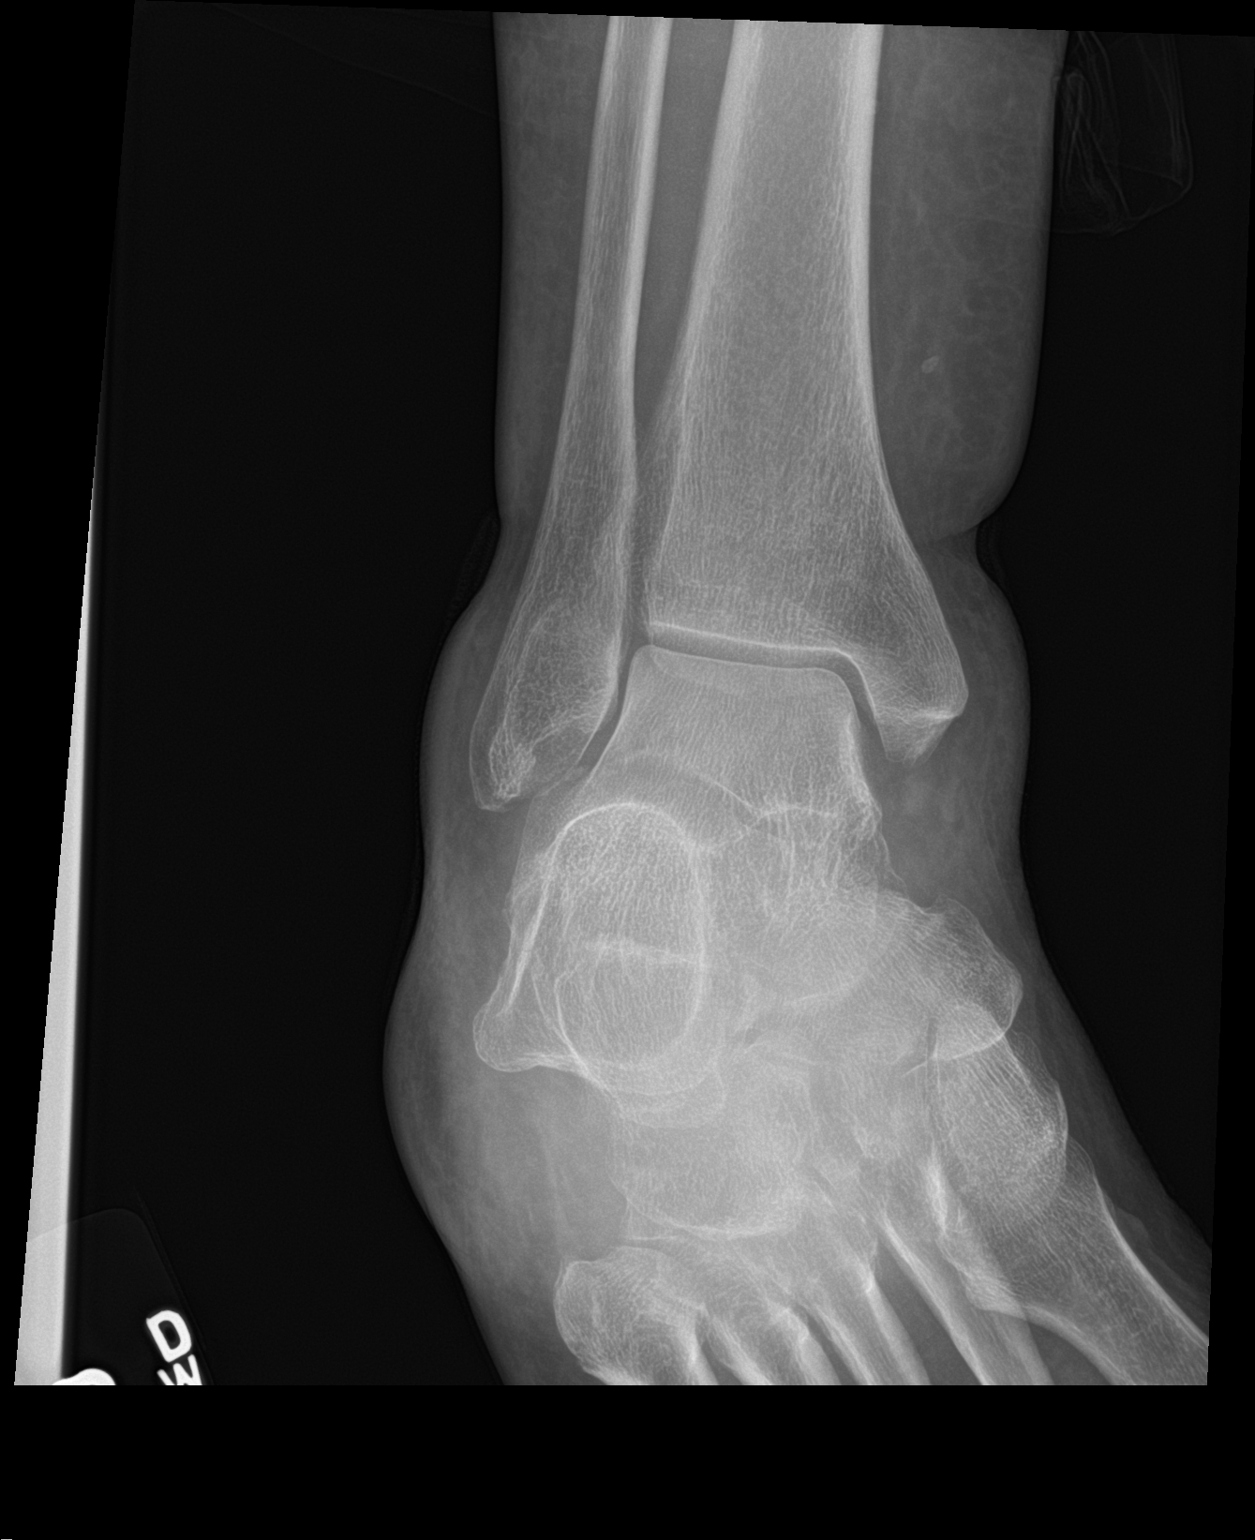

[ankle lat]
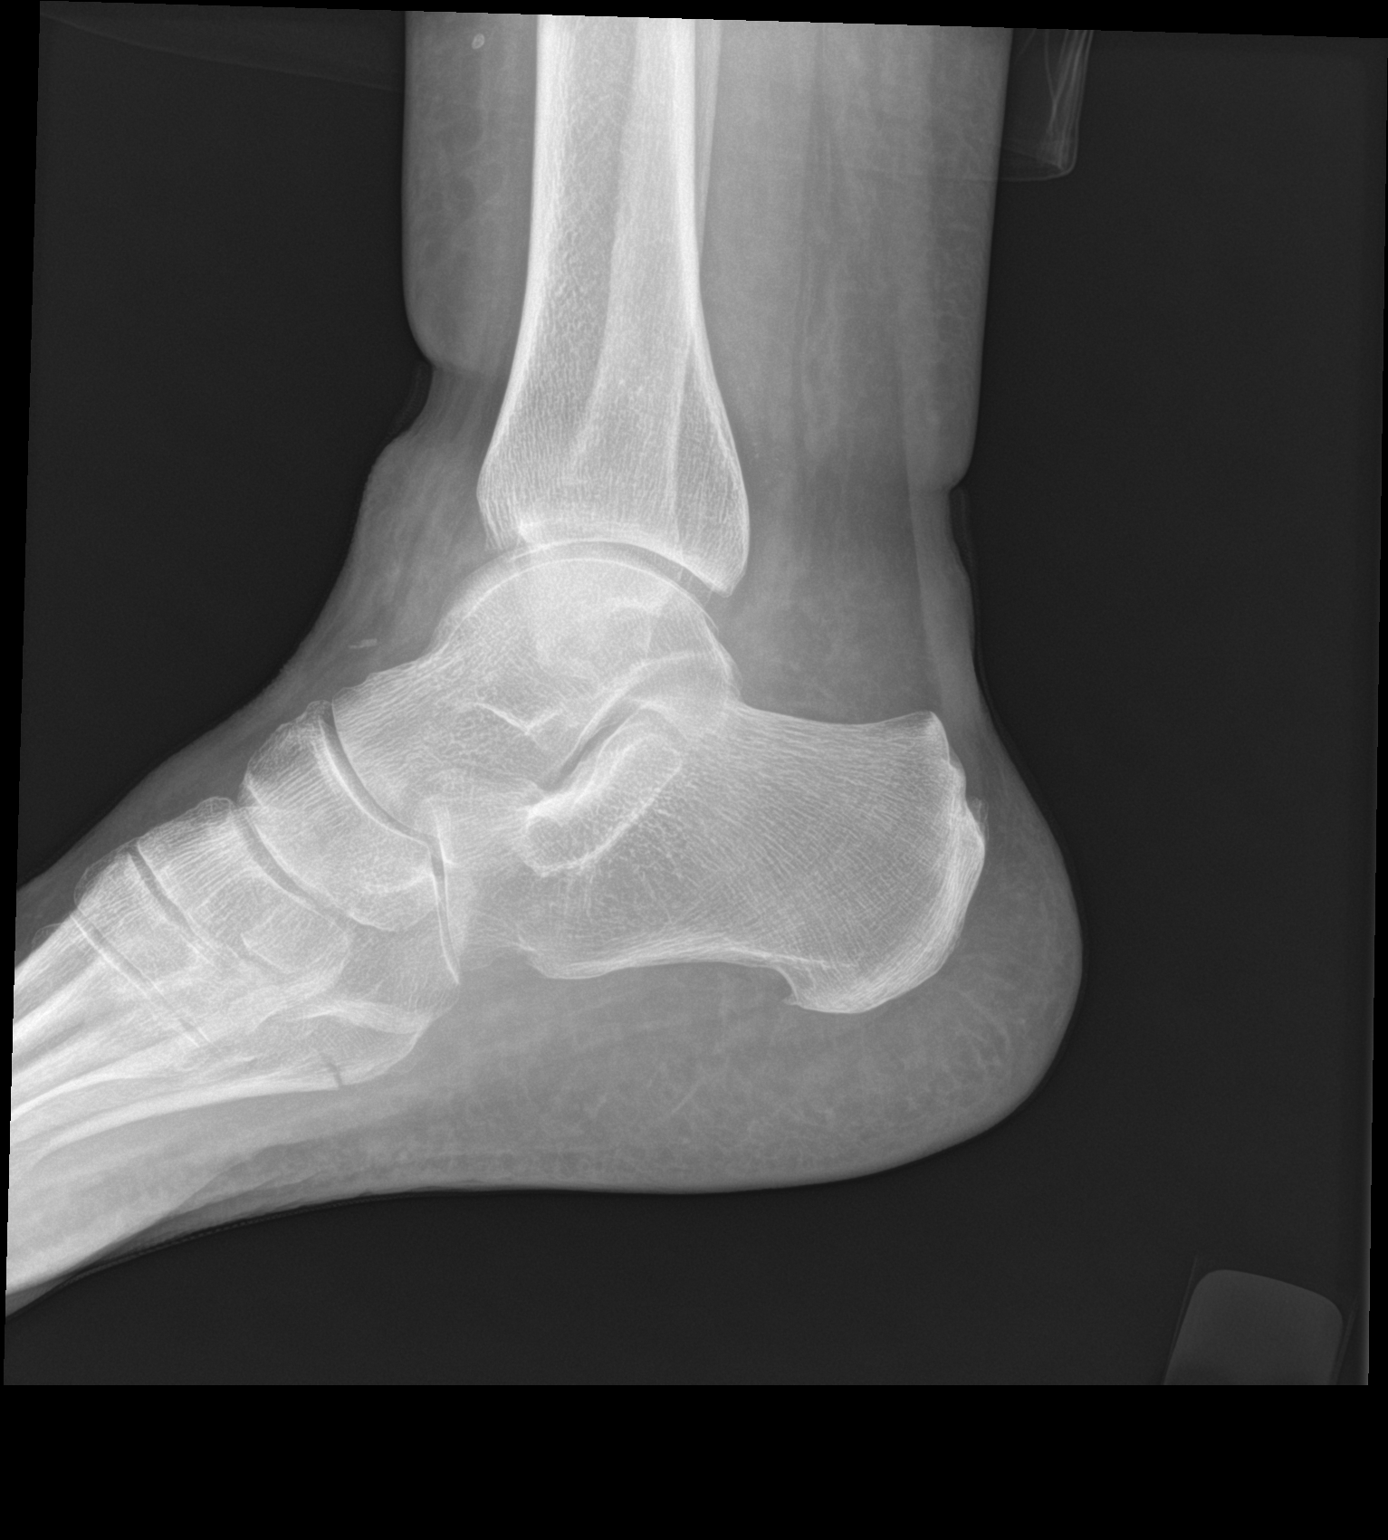

[3 of 3 positions shown; findings below may reference images not displayed]

FINDINGS: Undisplaced avulsion is noted from the tip of the distal fibula.
There is a fracture at the base of the fifth metatarsal best seen on
the lateral projection. No other fracture is seen. No soft tissue
abnormality is noted.
IMPRESSION: Avulsion fracture from the distal fibula as well as a mildly
displaced fracture at the base of the fifth metatarsal.

## 2023-07-08 ENCOUNTER — Ambulatory Visit: Payer: Medicare Other | Admitting: Occupational Therapy

## 2023-07-08 DIAGNOSIS — M6281 Muscle weakness (generalized): Secondary | ICD-10-CM

## 2023-07-08 DIAGNOSIS — R251 Tremor, unspecified: Secondary | ICD-10-CM | POA: Diagnosis present

## 2023-07-08 DIAGNOSIS — R278 Other lack of coordination: Secondary | ICD-10-CM | POA: Diagnosis present

## 2023-07-08 NOTE — Therapy (Signed)
OUTPATIENT OCCUPATIONAL THERAPY NEURO  Treatment Note  Patient Name: Theresa Eaton MRN: [id number] DOB:1950/11/02, 72 y.o., female Today's Date: 07/08/2023  PCP: Joaquim Nam, MD REFERRING PROVIDER: Ihor Austin, NP  END OF SESSION:  OT End of Session - 07/08/23 1203     Visit Number 2    Number of Visits 7    Date for OT Re-Evaluation 08/19/23    Authorization Type [employer]    OT Start Time 1102    OT Stop Time 1142    OT Time Calculation (min) 40 min              Past Medical History:  Diagnosis Date   Arthritis    bursitis in lt. and rt.   Chicken pox    Diabetes mellitus without complication (HCC)    history of no longer since 2015   GERD (gastroesophageal reflux disease)    Hyperlipidemia    in the past no longer has   Osteopenia    started on fosamax 2013   Tremor    Past Surgical History:  Procedure Laterality Date   COLONOSCOPY  10/25/2005   [place]/Normal     COLONOSCOPY WITH ESOPHAGOGASTRODUODENOSCOPY (EGD)  11/12/2022   Laser closure of greater saphenous vein, Left  2015   TUBAL LIGATION     Patient Active Problem List   Diagnosis Date Noted   Anemia 08/08/2022   GERD (gastroesophageal reflux disease) 08/19/2021   Tremor 03/08/2021   Sciatica 09/13/2019   Healthcare maintenance 09/05/2018   Advance care planning 09/18/2014   Medicare annual wellness visit, subsequent 03/09/2013   Hyperglycemia 11/07/2012   Hyperlipidemia 11/02/2012   Osteopenia 11/02/2012    ONSET DATE: referral 06/21/23 (symptoms ~2 years)  REFERRING DIAG: G25.0 (ICD-10-CM) - Essential tremor M25.641,M25.642 (ICD-10-CM) - Stiffness of joints of both hands  THERAPY DIAG:  Tremor  Muscle weakness (generalized)  Other lack of coordination  Rationale for Evaluation and Treatment: Rehabilitation  SUBJECTIVE:   SUBJECTIVE STATEMENT: "I apologize, I thought the appt was at 2:45 last week." Pt accompanied by: self  PERTINENT HISTORY: reflux disease,  hyperlipidemia, osteopenia, diabetes, arthritis, edema of both lower extremities, and obesity, who reports a bilateral upper extremity tremor  PRECAUTIONS: Fall  WEIGHT BEARING RESTRICTIONS: No  PAIN:  Are you having pain? No  FALLS: Has patient fallen in last 6 months? No  LIVING ENVIRONMENT: Lives with: lives with their spouse Lives in: House/apartment Stairs: Yes: External: 4 step in front, 4 steps in back steps; on left going up Has following equipment at home:  hand held shower head  PLOF: Independent and Independent with basic ADLs  PATIENT GOALS: to preserve handwriting  OBJECTIVE:   HAND DOMINANCE: Left  ADLs: Overall ADLs: Mod I to independent overall Eating: difficulty with opening certain types of jars Grooming: reports "conscious" of tremors with applying mascara  IADLs: Light housekeeping: Mod I Meal Prep: Mod I Community mobility: driving Medication management: Mod I Handwriting: 90% legible and Increased time  MOBILITY STATUS: Independent  POSTURE COMMENTS:  No Significant postural limitations  ACTIVITY TOLERANCE: Activity tolerance: WFL for tasks assessed on eval  FUNCTIONAL OUTCOME MEASURES: Quick Dash: 13.6 disability  UPPER EXTREMITY ROM:  WFL bilaterally   UPPER EXTREMITY MMT:   grossly 4+ to 5/5 overall, except R elbow extension 4-5   HAND FUNCTION: Grip strength: Right: 44 lbs; Left: 39 lbs  COORDINATION: Finger Nose Finger test: WFL, mild decreased speed,  9 Hole Peg test: Right: 23.34 sec; Left: 23.38 sec  Box and Blocks:  Right 59 blocks, Left 52 blocks Tremors: action, most noted during handwriting and carrying/holding wine chalice PPT #2 (self-feeding): 09.31 sec - mild tremor noted with scooping with spoon PPT #1: (handwriting) 17.41 sec (whales live in a blue ocean).  SENSATION: WFL  COGNITION: Overall cognitive status: Within functional limits for tasks assessed   OBSERVATIONS: Pt with tremors in ring fingers  bilaterally at rest, tremor most notable with handwriting.   TODAY'S TREATMENT:                                                                                  DATE:  07/08/23 Tremors: pt reports switching from taking her medication for tremors from evening to in the morning to better control her tremors.  Pt reports noting improvements with handwriting. Discussed handwriting strategies with use of elevated table top/angled surface to allow for increased bracing and support.  Pt reports going through her pens and choosing thicker circumference and heavier pens to aid in legibility with handwriting. Motor control: engaged in pouring water from cup to cup, noticing increased tremors when pouring therefore OT educated pt to support one cup on the rim of the other cup to allow for increased bracing to improve control when pouring.  Pt completed as above with improved control. Theraputty: OT providing pt with yellow (soft) theraputty and instructed in various pinch and grip strengthening exercises.  OT providing min demonstration and cues for functional carryover of each position. Exercises - Putty Squeezes  - 1 x daily - 1 sets - 10 reps - Finger Pinch and Pull with Putty  - 1 x daily - 1 sets - 10 reps - Tip Pinch with Putty  - 1 x daily - 1 sets - 10 reps - Key Pinch with Putty  - 1 x daily - 1 sets - 10 reps - Finger Lumbricals with Putty  - 1 x daily - 1 sets - 10 reps - Finger Exension with Putty  - 1 x daily - 1 sets - 10 reps   06/23/23 Educated on strategies to reduce action tremor.  Discussing wrist weights, weighted pens/utensil, an bracing arm against torso and/or support with opposite arm when possible.  Provided with handout from OT toolkit with recommendation to attempt various aspects and return to discuss at next session.  PATIENT EDUCATION: Education details: ongoing condition specific education Person educated: Patient Education method: Chief Technology Officer Education  comprehension: verbalized understanding and needs further education  HOME EXERCISE PROGRAM: Access Code: [email or web address] URL: [email or web address] Date: 07/08/2023 Prepared by: [facility] - [facility]   GOALS: Goals reviewed with patient? Yes  SHORT TERM GOALS: Target date: 07/22/23  Pt will be independent with HEP for improved fine motor control as needed for ADLs. Baseline: Goal status: IN PROGRESS  2.  Pt will verbalize understanding of improved body mechanics and use of AE for improved success with handwriting and self-feeding tasks.   Baseline:  Goal status: IN PROGRESS  3.  Pt will demonstrate improved UE functional use for ADLs as evidenced by increasing box/ blocks score by 5 blocks and improved motor control with dominant LUE. Baseline: R: 62, L:  52  Goal status: IN PROGRESS   LONG TERM GOALS: Target date: 08/19/23  Pt will write a paragraph for 5 mins with 95% legibility and no reports of fatigue with adaptive equipment or strategy as needed.  Baseline:  Goal status: IN PROGRESS  2.  Pt will demonstrate improved motor control to be able to carry cup/plate across room and maintain steady grasp when serving communion. Baseline:  Goal status: IN PROGRESS  3.  Pt will verbalize understanding of adaptive techniques/strategies to decrease tremors during ADLs/IADLs  Baseline:  Goal status: IN PROGRESS  ASSESSMENT:  CLINICAL IMPRESSION: Pt reports noticing improvements with motor control with change of medication from evening to morning to assist in decreased tremors.  Pt demonstrating good understanding of use of bracing and support when writing and when pouring and scooping foods.  Pt demonstrating good understanding of grip and pinch exercises with putty.    PERFORMANCE DEFICITS: in functional skills including ADLs, IADLs, coordination, strength, flexibility, Gross motor control, and UE functional use and psychosocial skills including  coping strategies and environmental adaptation.    PLAN:  OT FREQUENCY: 1x/week  OT DURATION: 8 weeks (6 visits over 8 weeks due to upcoming trip)  PLANNED INTERVENTIONS: self care/ADL training, therapeutic exercise, therapeutic activity, neuromuscular re-education, passive range of motion, functional mobility training, compression bandaging, moist heat, cryotherapy, patient/family education, psychosocial skills training, coping strategies training, and DME and/or AE instructions  RECOMMENDED OTHER SERVICES: NA  CONSULTED AND AGREED WITH PLAN OF CARE: Patient  PLAN FOR NEXT SESSION: review tremor strategies - trial variety of writing utensils, theraputty   Ladaija Dimino, OTR/L 07/08/2023, 12:03 PM

## 2023-07-26 ENCOUNTER — Ambulatory Visit: Payer: Medicare Other | Attending: Adult Health | Admitting: Occupational Therapy

## 2023-07-26 DIAGNOSIS — R278 Other lack of coordination: Secondary | ICD-10-CM | POA: Diagnosis present

## 2023-07-26 DIAGNOSIS — M6281 Muscle weakness (generalized): Secondary | ICD-10-CM | POA: Diagnosis present

## 2023-07-26 DIAGNOSIS — R251 Tremor, unspecified: Secondary | ICD-10-CM | POA: Insufficient documentation

## 2023-07-26 NOTE — Therapy (Signed)
OUTPATIENT OCCUPATIONAL THERAPY NEURO  Treatment Note  Patient Name: Theresa Eaton MRN: [id number] DOB:[birth year], 72 y.o., female Today's Date: 07/26/2023  PCP: Joaquim Nam, MD REFERRING PROVIDER: Ihor Austin, NP  END OF SESSION:  OT End of Session - 07/26/23 1406     Visit Number 3    Number of Visits 7    Date for OT Re-Evaluation 08/19/23    Authorization Type [employer] Medicare    OT Start Time 1403    OT Stop Time 1445    OT Time Calculation (min) 42 min               Past Medical History:  Diagnosis Date   Arthritis    bursitis in lt. and rt.   Chicken pox    Diabetes mellitus without complication (HCC)    history of no longer since 2015   GERD (gastroesophageal reflux disease)    Hyperlipidemia    in the past no longer has   Osteopenia    started on fosamax 2013   Tremor    Past Surgical History:  Procedure Laterality Date   COLONOSCOPY  10/25/2005   [place]/Normal     COLONOSCOPY WITH ESOPHAGOGASTRODUODENOSCOPY (EGD)  11/12/2022   Laser closure of greater saphenous vein, Left  2015   TUBAL LIGATION     Patient Active Problem List   Diagnosis Date Noted   Anemia 08/08/2022   GERD (gastroesophageal reflux disease) 08/19/2021   Tremor 03/08/2021   Sciatica 09/13/2019   Healthcare maintenance 09/05/2018   Advance care planning 09/18/2014   Medicare annual wellness visit, subsequent 03/09/2013   Hyperglycemia 11/07/2012   Hyperlipidemia 11/02/2012   Osteopenia 11/02/2012    ONSET DATE: referral 06/21/23 (symptoms ~2 years)  REFERRING DIAG: G25.0 (ICD-10-CM) - Essential tremor M25.641,M25.642 (ICD-10-CM) - Stiffness of joints of both hands  THERAPY DIAG:  Tremor  Muscle weakness (generalized)  Other lack of coordination  Rationale for Evaluation and Treatment: Rehabilitation  SUBJECTIVE:   SUBJECTIVE STATEMENT: Pt reports taking her putty with her on the trip, but only did exercises one time. Pt accompanied by: self  PERTINENT  HISTORY: reflux disease, hyperlipidemia, osteopenia, diabetes, arthritis, edema of both lower extremities, and obesity, who reports a bilateral upper extremity tremor  PRECAUTIONS: Fall  WEIGHT BEARING RESTRICTIONS: No  PAIN:  Are you having pain? No  FALLS: Has patient fallen in last 6 months? No  LIVING ENVIRONMENT: Lives with: lives with their spouse Lives in: House/apartment Stairs: Yes: External: 4 step in front, 4 steps in back steps; on left going up Has following equipment at home:  hand held shower head  PLOF: Independent and Independent with basic ADLs  PATIENT GOALS: to preserve handwriting  OBJECTIVE:   HAND DOMINANCE: Left  ADLs: Overall ADLs: Mod I to independent overall Eating: difficulty with opening certain types of jars Grooming: reports "conscious" of tremors with applying mascara  IADLs: Light housekeeping: Mod I Meal Prep: Mod I Community mobility: driving Medication management: Mod I Handwriting: 90% legible and Increased time  MOBILITY STATUS: Independent  POSTURE COMMENTS:  No Significant postural limitations  ACTIVITY TOLERANCE: Activity tolerance: WFL for tasks assessed on eval  FUNCTIONAL OUTCOME MEASURES: Quick Dash: 13.6 disability  UPPER EXTREMITY ROM:  WFL bilaterally   UPPER EXTREMITY MMT:   grossly 4+ to 5/5 overall, except R elbow extension 4-5   HAND FUNCTION: Grip strength: Right: 44 lbs; Left: 39 lbs  COORDINATION: Finger Nose Finger test: WFL, mild decreased speed,  9 Hole Peg test:  Right: 23.34 sec; Left: 23.38 sec Box and Blocks:  Right 59 blocks, Left 52 blocks Tremors: action, most noted during handwriting and carrying/holding wine chalice PPT #2 (self-feeding): 09.31 sec - mild tremor noted with scooping with spoon PPT #1: (handwriting) 17.41 sec (whales live in a blue ocean).  SENSATION: WFL  COGNITION: Overall cognitive status: Within functional limits for tasks assessed   OBSERVATIONS: Pt with  tremors in ring fingers bilaterally at rest, tremor most notable with handwriting.   TODAY'S TREATMENT:                                                                                  DATE:  07/26/23 Cup stacking: stacking cups in 10 frame pyramid form to facilitate increased functional and sustained grasp on cups.  Pt demonstrating increased challenge when completing with dominant LUE, dropping 1 cup and demonstrating looser gross grasp when picking up and manipulating cups.  Reviewed full grasp on cups to increase motor control and proximity of arm and/or body positioning to increase motor control when possible. Coordination: engaged in small peg board pattern replication with focus on [facility].  Pt completing with dominant LUE with no overt signs of tremor.  OT increased challenge to completing in-hand manipulation and translation of pegs one at a time from palm to finger tips to place into peg board.   Theraband: engaged in below exercises with red theraband with focus on controlled movement and sustained hold 2-3 seconds at end range.   - Seated Elbow Flexion with Self-Anchored Resistance  - 1 x daily - 2 sets - 10 reps - Seated Elbow Extension with Self-Anchored Resistance  - 1 x daily - 2 sets - 10 reps - Seated Shoulder Extension with Resistance  - 1 x daily - 2 sets - 10 reps - Seated Shoulder Flexion with Resistance  - 1 x daily - 2 sets - 10 reps - Seated Shoulder Horizontal Abduction with Resistance  - 1 x daily - 2 sets - 10 reps    07/08/23 Tremors: pt reports switching from taking her medication for tremors from evening to in the morning to better control her tremors.  Pt reports noting improvements with handwriting. Discussed handwriting strategies with use of elevated table top/angled surface to allow for increased bracing and support.  Pt reports going through her pens and choosing thicker circumference and heavier pens to aid in legibility with handwriting. Motor control: engaged in  pouring water from cup to cup, noticing increased tremors when pouring therefore OT educated pt to support one cup on the rim of the other cup to allow for increased bracing to improve control when pouring.  Pt completed as above with improved control. Theraputty: OT providing pt with yellow (soft) theraputty and instructed in various pinch and grip strengthening exercises.  OT providing min demonstration and cues for functional carryover of each position. Exercises - Putty Squeezes  - 1 x daily - 1 sets - 10 reps - Finger Pinch and Pull with Putty  - 1 x daily - 1 sets - 10 reps - Tip Pinch with Putty  - 1 x daily - 1 sets - 10 reps - Key Pinch with Putty  -  1 x daily - 1 sets - 10 reps - Finger Lumbricals with Putty  - 1 x daily - 1 sets - 10 reps - Finger Exension with Putty  - 1 x daily - 1 sets - 10 reps   06/23/23 Educated on strategies to reduce action tremor.  Discussing wrist weights, weighted pens/utensil, an bracing arm against torso and/or support with opposite arm when possible.  Provided with handout from OT toolkit with recommendation to attempt various aspects and return to discuss at next session.  PATIENT EDUCATION: Education details: ongoing condition specific education Person educated: Patient Education method: Chief Technology Officer Education comprehension: verbalized understanding and needs further education  HOME EXERCISE PROGRAM: Access Code: [email or web address] URL: [email or web address] Date: 07/08/2023 Prepared by: [facility] - Outpatient  Rehab - [facility]   GOALS: Goals reviewed with patient? Yes  SHORT TERM GOALS: Target date: 07/22/23  Pt will be independent with HEP for improved fine motor control as needed for ADLs. Baseline: Goal status: IN PROGRESS  2.  Pt will verbalize understanding of improved body mechanics and use of AE for improved success with handwriting and self-feeding tasks.   Baseline:  Goal status: IN PROGRESS  3.  Pt  will demonstrate improved UE functional use for ADLs as evidenced by increasing box/ blocks score by 5 blocks and improved motor control with dominant LUE. Baseline: R: 62, L: 52  Goal status: IN PROGRESS   LONG TERM GOALS: Target date: 08/19/23  Pt will write a paragraph for 5 mins with 95% legibility and no reports of fatigue with adaptive equipment or strategy as needed.  Baseline:  Goal status: IN PROGRESS  2.  Pt will demonstrate improved motor control to be able to carry cup/plate across room and maintain steady grasp when serving communion. Baseline:  Goal status: IN PROGRESS  3.  Pt will verbalize understanding of adaptive techniques/strategies to decrease tremors during ADLs/IADLs  Baseline:  Goal status: IN PROGRESS  ASSESSMENT:  CLINICAL IMPRESSION: Pt reports utilizing bracing arm on table top during handwriting and keeping arms in close proximity when holding chalice for communion, noticing decrease in onset of tremors.  Pt reports noticing improvements in grip strength with opening containers.  Demonstrating understanding of purpose of technique for theraband exercises for additional proximal strengthening.  PERFORMANCE DEFICITS: in functional skills including ADLs, IADLs, coordination, strength, flexibility, Gross motor control, and UE functional use and psychosocial skills including coping strategies and environmental adaptation.    PLAN:  OT FREQUENCY: 1x/week  OT DURATION: 8 weeks (6 visits over 8 weeks due to upcoming trip)  PLANNED INTERVENTIONS: self care/ADL training, therapeutic exercise, therapeutic activity, neuromuscular re-education, passive range of motion, functional mobility training, compression bandaging, moist heat, cryotherapy, patient/family education, psychosocial skills training, coping strategies training, and DME and/or AE instructions  RECOMMENDED OTHER SERVICES: NA  CONSULTED AND AGREED WITH PLAN OF CARE: Patient  PLAN FOR NEXT SESSION:  review tremor strategies - trial variety of writing utensils, review theraputty as needed.  Engaged in scooping of foods and sustained hold of items.    Rosalio Loud, OTR/L 07/26/2023, 2:06 PM  [facility] at [facility] [address] [place], [address] [place], [place] [address] Phone # [phone] Fax # [phone]

## 2023-07-29 ENCOUNTER — Other Ambulatory Visit: Payer: Self-pay | Admitting: Family Medicine

## 2023-07-29 ENCOUNTER — Telehealth: Payer: Self-pay | Admitting: Family Medicine

## 2023-07-29 DIAGNOSIS — D649 Anemia, unspecified: Secondary | ICD-10-CM

## 2023-07-29 DIAGNOSIS — M8580 Other specified disorders of bone density and structure, unspecified site: Secondary | ICD-10-CM

## 2023-07-29 DIAGNOSIS — R251 Tremor, unspecified: Secondary | ICD-10-CM

## 2023-07-29 DIAGNOSIS — R739 Hyperglycemia, unspecified: Secondary | ICD-10-CM

## 2023-07-29 DIAGNOSIS — E785 Hyperlipidemia, unspecified: Secondary | ICD-10-CM

## 2023-07-29 NOTE — Telephone Encounter (Signed)
Patient would like her cpe lab orders sent to [facility] to have drawn.

## 2023-07-29 NOTE — Telephone Encounter (Signed)
Patient due for Medicare wellness this month. Please call to schedule.

## 2023-07-29 NOTE — Telephone Encounter (Signed)
Patient will callback to schedule or make appointment online.

## 2023-07-31 NOTE — Telephone Encounter (Signed)
I put in the orders and she should be able to get them done there.  Thanks.

## 2023-08-01 NOTE — Telephone Encounter (Signed)
Pt has been notified.

## 2023-08-02 ENCOUNTER — Ambulatory Visit: Payer: Medicare Other | Admitting: Occupational Therapy

## 2023-08-02 DIAGNOSIS — R278 Other lack of coordination: Secondary | ICD-10-CM

## 2023-08-02 DIAGNOSIS — M6281 Muscle weakness (generalized): Secondary | ICD-10-CM

## 2023-08-02 DIAGNOSIS — R251 Tremor, unspecified: Secondary | ICD-10-CM

## 2023-08-02 NOTE — Therapy (Signed)
OUTPATIENT OCCUPATIONAL THERAPY NEURO  Treatment Note  Patient Name: Theresa Eaton MRN: [id number] DOB:08-10-1951, 72 y.o., female Today's Date: 08/02/2023  PCP: Joaquim Nam, MD REFERRING PROVIDER: Ihor Austin, NP  END OF SESSION:  OT End of Session - 08/02/23 0933     Visit Number 4    Number of Visits 7    Date for OT Re-Evaluation 08/19/23    Authorization Type [employer] Medicare    OT Start Time 0932    OT Stop Time 1011    OT Time Calculation (min) 39 min                Past Medical History:  Diagnosis Date   Arthritis    bursitis in lt. and rt.   Chicken pox    Diabetes mellitus without complication (HCC)    history of no longer since 2015   GERD (gastroesophageal reflux disease)    Hyperlipidemia    in the past no longer has   Osteopenia    started on fosamax 2013   Tremor    Past Surgical History:  Procedure Laterality Date   COLONOSCOPY  10/25/2005   [place]/Normal     COLONOSCOPY WITH ESOPHAGOGASTRODUODENOSCOPY (EGD)  11/12/2022   Laser closure of greater saphenous vein, Left  2015   TUBAL LIGATION     Patient Active Problem List   Diagnosis Date Noted   Anemia 08/08/2022   GERD (gastroesophageal reflux disease) 08/19/2021   Tremor 03/08/2021   Healthcare maintenance 09/05/2018   Advance care planning 09/18/2014   Medicare annual wellness visit, subsequent 03/09/2013   Hyperglycemia 11/07/2012   Hyperlipidemia 11/02/2012   Osteopenia 11/02/2012    ONSET DATE: referral 06/21/23 (symptoms ~2 years)  REFERRING DIAG: G25.0 (ICD-10-CM) - Essential tremor M25.641,M25.642 (ICD-10-CM) - Stiffness of joints of both hands  THERAPY DIAG:  Tremor  Muscle weakness (generalized)  Other lack of coordination  Rationale for Evaluation and Treatment: Rehabilitation  SUBJECTIVE:   SUBJECTIVE STATEMENT: Pt reports this is her first and only morning appt, which she typically uses for reading and study. Pt accompanied by: self  PERTINENT  HISTORY: reflux disease, hyperlipidemia, osteopenia, diabetes, arthritis, edema of both lower extremities, and obesity, who reports a bilateral upper extremity tremor  PRECAUTIONS: Fall  WEIGHT BEARING RESTRICTIONS: No  PAIN:  Are you having pain? No  FALLS: Has patient fallen in last 6 months? No  LIVING ENVIRONMENT: Lives with: lives with their spouse Lives in: House/apartment Stairs: Yes: External: 4 step in front, 4 steps in back steps; on left going up Has following equipment at home:  hand held shower head  PLOF: Independent and Independent with basic ADLs  PATIENT GOALS: to preserve handwriting  OBJECTIVE:   HAND DOMINANCE: Left  ADLs: Overall ADLs: Mod I to independent overall Eating: difficulty with opening certain types of jars Grooming: reports "conscious" of tremors with applying mascara  IADLs: Light housekeeping: Mod I Meal Prep: Mod I Community mobility: driving Medication management: Mod I Handwriting: 90% legible and Increased time  MOBILITY STATUS: Independent  POSTURE COMMENTS:  No Significant postural limitations  ACTIVITY TOLERANCE: Activity tolerance: WFL for tasks assessed on eval  FUNCTIONAL OUTCOME MEASURES: Quick Dash: 13.6 disability  UPPER EXTREMITY ROM:  WFL bilaterally   UPPER EXTREMITY MMT:   grossly 4+ to 5/5 overall, except R elbow extension 4-5   HAND FUNCTION: Grip strength: Right: 44 lbs; Left: 39 lbs  COORDINATION: Finger Nose Finger test: WFL, mild decreased speed,  9 Hole Peg test: Right:  23.34 sec; Left: 23.38 sec Box and Blocks:  Right 59 blocks, Left 52 blocks Tremors: action, most noted during handwriting and carrying/holding wine chalice PPT #2 (self-feeding): 09.31 sec - mild tremor noted with scooping with spoon PPT #1: (handwriting) 17.41 sec (whales live in a blue ocean).  SENSATION: WFL  COGNITION: Overall cognitive status: Within functional limits for tasks assessed   OBSERVATIONS: Pt with  tremors in ring fingers bilaterally at rest, tremor most notable with handwriting.   TODAY'S TREATMENT:                                                                                  DATE:  08/02/23 Tremor reducing strategies: engaged in discussion and education regarding functional tasks that can address UB strengthening and proximal strengthening as recommended to reduce tremors.  Provided with handout from OT toolkit. Digit and grip strengthening: engaged in grip strengthening with large velcro roller with focus on rolling/rotating velcro dowel in vertical and horizontal plane to carry over to grip strength as needed for opening jars and other items.  Engaged in finger flexion and extension against resistance with use of rubber band.  OT providing demonstration and pt returning demonstration.  OT reviewed finger flexion, thumb opposition, and finger extension with theraputty.   07/26/23 Cup stacking: stacking cups in 10 frame pyramid form to facilitate increased functional and sustained grasp on cups.  Pt demonstrating increased challenge when completing with dominant LUE, dropping 1 cup and demonstrating looser gross grasp when picking up and manipulating cups.  Reviewed full grasp on cups to increase motor control and proximity of arm and/or body positioning to increase motor control when possible. Coordination: engaged in small peg board pattern replication with focus on [facility].  Pt completing with dominant LUE with no overt signs of tremor.  OT increased challenge to completing in-hand manipulation and translation of pegs one at a time from palm to finger tips to place into peg board.   Theraband: engaged in below exercises with red theraband with focus on controlled movement and sustained hold 2-3 seconds at end range.   - Seated Elbow Flexion with Self-Anchored Resistance  - 1 x daily - 2 sets - 10 reps - Seated Elbow Extension with Self-Anchored Resistance  - 1 x daily - 2 sets - 10 reps -  Seated Shoulder Extension with Resistance  - 1 x daily - 2 sets - 10 reps - Seated Shoulder Flexion with Resistance  - 1 x daily - 2 sets - 10 reps - Seated Shoulder Horizontal Abduction with Resistance  - 1 x daily - 2 sets - 10 reps    07/08/23 Tremors: pt reports switching from taking her medication for tremors from evening to in the morning to better control her tremors.  Pt reports noting improvements with handwriting. Discussed handwriting strategies with use of elevated table top/angled surface to allow for increased bracing and support.  Pt reports going through her pens and choosing thicker circumference and heavier pens to aid in legibility with handwriting. Motor control: engaged in pouring water from cup to cup, noticing increased tremors when pouring therefore OT educated pt to support one cup on the rim of the  other cup to allow for increased bracing to improve control when pouring.  Pt completed as above with improved control. Theraputty: OT providing pt with yellow (soft) theraputty and instructed in various pinch and grip strengthening exercises.  OT providing min demonstration and cues for functional carryover of each position. Exercises - Putty Squeezes  - 1 x daily - 1 sets - 10 reps - Finger Pinch and Pull with Putty  - 1 x daily - 1 sets - 10 reps - Tip Pinch with Putty  - 1 x daily - 1 sets - 10 reps - Key Pinch with Putty  - 1 x daily - 1 sets - 10 reps - Finger Lumbricals with Putty  - 1 x daily - 1 sets - 10 reps - Finger Exension with Putty  - 1 x daily - 1 sets - 10 reps  PATIENT EDUCATION: Education details: ongoing condition specific education Person educated: Patient Education method: Explanation and Handouts Education comprehension: verbalized understanding and needs further education  HOME EXERCISE PROGRAM: Access Code: [email or web address] URL: [email or web address] Date: 08/02/2023 Prepared by: [facility] - Outpatient  Rehab - [facility]  GOALS: Goals reviewed with patient? Yes  SHORT TERM GOALS: Target date: 07/22/23  Pt will be independent with HEP for improved fine motor control as needed for ADLs. Baseline: Goal status: MET  2.  Pt will verbalize understanding of improved body mechanics and use of AE for improved success with handwriting and self-feeding tasks.   Baseline:  Goal status: MET  3.  Pt will demonstrate improved UE functional use for ADLs as evidenced by increasing box/ blocks score by 5 blocks and improved motor control with dominant LUE. Baseline: R: 62, L: 52  Goal status: IN PROGRESS   LONG TERM GOALS: Target date: 08/19/23  Pt will write a paragraph for 5 mins with 95% legibility and no reports of fatigue with adaptive equipment or strategy as needed.  Baseline:  Goal status: IN PROGRESS  2.  Pt will demonstrate improved motor control to be able to carry cup/plate across room and maintain steady grasp when serving communion. Baseline:  Goal status: IN PROGRESS  3.  Pt will verbalize understanding of adaptive techniques/strategies to decrease tremors during ADLs/IADLs  Baseline:  Goal status: IN PROGRESS  ASSESSMENT:  CLINICAL IMPRESSION: Pt reports understanding of body mechanics and holding items in proximity to body for increased stability and control. Pt still with decreased grip and pinch strength with opening containers, therefore focused on modifications of exercises to facilitate increased carryover.    PERFORMANCE DEFICITS: in functional skills including ADLs, IADLs, coordination, strength, flexibility, Gross motor control, and UE functional use and psychosocial skills including coping strategies and environmental adaptation.    PLAN:  OT FREQUENCY: 1x/week  OT DURATION: 8 weeks (6 visits over 8 weeks due to upcoming trip)  PLANNED INTERVENTIONS: self care/ADL training, therapeutic exercise, therapeutic activity, neuromuscular re-education, passive range of motion,  functional mobility training, compression bandaging, moist heat, cryotherapy, patient/family education, psychosocial skills training, coping strategies training, and DME and/or AE instructions  RECOMMENDED OTHER SERVICES: NA  CONSULTED AND AGREED WITH PLAN OF CARE: Patient  PLAN FOR NEXT SESSION: review tremor strategies - trial variety of writing utensils, Engage in handwriting.  Complete box and blocks   Rosalio Loud, OTR/L 08/02/2023, 11:08 AM  [facility] at [facility] [address] [place], [address] [place], [place] [address] Phone # [phone] Fax # [phone]

## 2023-08-03 ENCOUNTER — Other Ambulatory Visit (INDEPENDENT_AMBULATORY_CARE_PROVIDER_SITE_OTHER): Payer: Medicare Other

## 2023-08-03 DIAGNOSIS — M8580 Other specified disorders of bone density and structure, unspecified site: Secondary | ICD-10-CM

## 2023-08-03 DIAGNOSIS — E785 Hyperlipidemia, unspecified: Secondary | ICD-10-CM

## 2023-08-03 DIAGNOSIS — D649 Anemia, unspecified: Secondary | ICD-10-CM | POA: Diagnosis not present

## 2023-08-03 DIAGNOSIS — R251 Tremor, unspecified: Secondary | ICD-10-CM | POA: Diagnosis not present

## 2023-08-03 DIAGNOSIS — R739 Hyperglycemia, unspecified: Secondary | ICD-10-CM

## 2023-08-03 LAB — COMPREHENSIVE METABOLIC PANEL
ALT: 11 U/L (ref 0–35)
AST: 18 U/L (ref 0–37)
Albumin: 4.2 g/dL (ref 3.5–5.2)
Alkaline Phosphatase: 80 U/L (ref 39–117)
BUN: 11 mg/dL (ref 6–23)
CO2: 30 meq/L (ref 19–32)
Calcium: 9.5 mg/dL (ref 8.4–10.5)
Chloride: 101 meq/L (ref 96–112)
Creatinine, Ser: 0.66 mg/dL (ref 0.40–1.20)
GFR: 86.32 mL/min (ref >60.00)
Glucose, Bld: 108 mg/dL — ABNORMAL HIGH (ref 70–99)
Potassium: 4.9 meq/L (ref 3.5–5.1)
Sodium: 139 meq/L (ref 135–145)
Total Bilirubin: 0.5 mg/dL (ref 0.2–1.2)
Total Protein: 6.8 g/dL (ref 6.0–8.3)

## 2023-08-03 LAB — IRON: Iron: 124 ug/dL (ref 42–145)

## 2023-08-03 LAB — HEMOGLOBIN A1C: Hgb A1c MFr Bld: 5.9 % (ref 4.6–6.5)

## 2023-08-03 LAB — CBC WITH DIFFERENTIAL/PLATELET
Basophils Absolute: 0 10*3/uL (ref 0.0–0.1)
Basophils Relative: 0.6 % (ref 0.0–3.0)
Eosinophils Absolute: 0.1 10*3/uL (ref 0.0–0.7)
Eosinophils Relative: 2.3 % (ref 0.0–5.0)
HCT: 40.8 % (ref 36.0–46.0)
Hemoglobin: 13.5 g/dL (ref 12.0–15.0)
Lymphocytes Relative: 38.4 % (ref 12.0–46.0)
Lymphs Abs: 2 10*3/uL (ref 0.7–4.0)
MCHC: 33 g/dL (ref 30.0–36.0)
MCV: 93.8 fL (ref 78.0–100.0)
Monocytes Absolute: 0.5 10*3/uL (ref 0.1–1.0)
Monocytes Relative: 10.2 % (ref 3.0–12.0)
Neutro Abs: 2.6 10*3/uL (ref 1.4–7.7)
Neutrophils Relative %: 48.5 % (ref 43.0–77.0)
Platelets: 276 10*3/uL (ref 150.0–400.0)
RBC: 4.35 Mil/uL (ref 3.87–5.11)
RDW: 13.1 % (ref 11.5–15.5)
WBC: 5.3 10*3/uL (ref 4.0–10.5)

## 2023-08-03 LAB — LIPID PANEL
Cholesterol: 229 mg/dL — ABNORMAL HIGH (ref 0–200)
HDL: 58.4 mg/dL (ref >39.00)
LDL Cholesterol: 146 mg/dL — ABNORMAL HIGH (ref 0–99)
NonHDL: 170.96
Total CHOL/HDL Ratio: 4
Triglycerides: 124 mg/dL (ref 0.0–149.0)
VLDL: 24.8 mg/dL (ref 0.0–40.0)

## 2023-08-03 LAB — FERRITIN: Ferritin: 56.5 ng/mL (ref 10.0–291.0)

## 2023-08-03 LAB — TSH: TSH: 4.13 u[IU]/mL (ref 0.35–5.50)

## 2023-08-03 LAB — VITAMIN D 25 HYDROXY (VIT D DEFICIENCY, FRACTURES): VITD: 46.48 ng/mL (ref 30.00–100.00)

## 2023-08-09 ENCOUNTER — Ambulatory Visit: Payer: Medicare Other | Admitting: Occupational Therapy

## 2023-08-09 DIAGNOSIS — M6281 Muscle weakness (generalized): Secondary | ICD-10-CM

## 2023-08-09 DIAGNOSIS — R251 Tremor, unspecified: Secondary | ICD-10-CM

## 2023-08-09 NOTE — Therapy (Signed)
OUTPATIENT OCCUPATIONAL THERAPY NEURO  Treatment Note & Discharge  Patient Name: Theresa Eaton MRN: [id number] DOB:1950/12/08, 72 y.o., female Today's Date: 08/09/2023  PCP: Joaquim Nam, MD REFERRING PROVIDER: Ihor Austin, NP  OCCUPATIONAL THERAPY DISCHARGE SUMMARY  Visits from Start of Care: 5  Current functional level related to goals / functional outcomes: Pt with improvements in motor control and reports use of modifications for body positioning and proximity during functional tasks for improved motor control.   Remaining deficits: Tremors impacting handwriting   Education / Equipment: UB and grip/pinch strengthening HEP, education on modifications and body positioning for management of tremors   Patient agrees to discharge. Patient goals were met. Patient is being discharged due to meeting the stated rehab goals..     END OF SESSION:  OT End of Session - 08/09/23 0945     Visit Number 5    Number of Visits 7    Date for OT Re-Evaluation 08/19/23    Authorization Type [employer] Medicare    OT Start Time 0940    OT Stop Time 1013    OT Time Calculation (min) 33 min                 Past Medical History:  Diagnosis Date   Arthritis    bursitis in lt. and rt.   Chicken pox    Diabetes mellitus without complication (HCC)    history of no longer since 2015   GERD (gastroesophageal reflux disease)    Hyperlipidemia    in the past no longer has   Osteopenia    started on fosamax 2013   Tremor    Past Surgical History:  Procedure Laterality Date   COLONOSCOPY  10/25/2005   [place]/Normal     COLONOSCOPY WITH ESOPHAGOGASTRODUODENOSCOPY (EGD)  11/12/2022   Laser closure of greater saphenous vein, Left  2015   TUBAL LIGATION     Patient Active Problem List   Diagnosis Date Noted   Anemia 08/08/2022   GERD (gastroesophageal reflux disease) 08/19/2021   Tremor 03/08/2021   Healthcare maintenance 09/05/2018   Advance care planning 09/18/2014    Medicare annual wellness visit, subsequent 03/09/2013   Hyperglycemia 11/07/2012   Hyperlipidemia 11/02/2012   Osteopenia 11/02/2012    ONSET DATE: referral 06/21/23 (symptoms ~2 years)  REFERRING DIAG: G25.0 (ICD-10-CM) - Essential tremor M25.641,M25.642 (ICD-10-CM) - Stiffness of joints of both hands  THERAPY DIAG:  Tremor  Muscle weakness (generalized)  Rationale for Evaluation and Treatment: Rehabilitation  SUBJECTIVE:   SUBJECTIVE STATEMENT: Pt reports feeling like her fingers are getting stronger. Pt accompanied by: self  PERTINENT HISTORY: reflux disease, hyperlipidemia, osteopenia, diabetes, arthritis, edema of both lower extremities, and obesity, who reports a bilateral upper extremity tremor  PRECAUTIONS: Fall  WEIGHT BEARING RESTRICTIONS: No  PAIN:  Are you having pain? No  FALLS: Has patient fallen in last 6 months? No  LIVING ENVIRONMENT: Lives with: lives with their spouse Lives in: House/apartment Stairs: Yes: External: 4 step in front, 4 steps in back steps; on left going up Has following equipment at home:  hand held shower head  PLOF: Independent and Independent with basic ADLs  PATIENT GOALS: to preserve handwriting  OBJECTIVE:   HAND DOMINANCE: Left  ADLs: Overall ADLs: Mod I to independent overall Eating: difficulty with opening certain types of jars Grooming: reports "conscious" of tremors with applying mascara  IADLs: Light housekeeping: Mod I Meal Prep: Mod I Community mobility: driving Medication management: Mod I Handwriting: 90% legible and  Increased time  MOBILITY STATUS: Independent  POSTURE COMMENTS:  No Significant postural limitations  ACTIVITY TOLERANCE: Activity tolerance: WFL for tasks assessed on eval  FUNCTIONAL OUTCOME MEASURES: Quick Dash: 13.6 disability  UPPER EXTREMITY ROM:  WFL bilaterally   UPPER EXTREMITY MMT:   grossly 4+ to 5/5 overall, except R elbow extension 4-5   HAND FUNCTION: Grip  strength: Right: 44 lbs; Left: 39 lbs  COORDINATION: Finger Nose Finger test: WFL, mild decreased speed,  9 Hole Peg test: Right: 23.34 sec; Left: 23.38 sec Box and Blocks:  Right 59 blocks, Left 52 blocks Tremors: action, most noted during handwriting and carrying/holding wine chalice PPT #2 (self-feeding): 09.31 sec - mild tremor noted with scooping with spoon PPT #1: (handwriting) 17.41 sec (whales live in a blue ocean).  SENSATION: WFL  COGNITION: Overall cognitive status: Within functional limits for tasks assessed   OBSERVATIONS: Pt with tremors in ring fingers bilaterally at rest, tremor most notable with handwriting.   TODAY'S TREATMENT:                                                                                  DATE:  08/09/23 Handwriting:  Engaged in writing a few sentence passage with still some evidence of tremors with shakiness with writing.  Pt does report noticing improvements occasionally.  OT educated on recommendations for WB and/or strengthening exercises prior to engaging in prolonged writing, providing demonstration and reiterating use of theraputty and/or rubber band. Box and blocks: L: 60 blocks Reviewed recommendations for carrying item and scooping food with decreased tremors.  OT reviewed body posture and proximity of UE to body during functional tasks and serving communion. Pt reports improvements with managing chalice and plate and use of stabilizing against torso for improved control as needed.   08/02/23 Tremor reducing strategies: engaged in discussion and education regarding functional tasks that can address UB strengthening and proximal strengthening as recommended to reduce tremors.  Provided with handout from OT toolkit. Digit and grip strengthening: engaged in grip strengthening with large velcro roller with focus on rolling/rotating velcro dowel in vertical and horizontal plane to carry over to grip strength as needed for opening jars and other  items.  Engaged in finger flexion and extension against resistance with use of rubber band.  OT providing demonstration and pt returning demonstration.  OT reviewed finger flexion, thumb opposition, and finger extension with theraputty.   07/26/23 Cup stacking: stacking cups in 10 frame pyramid form to facilitate increased functional and sustained grasp on cups.  Pt demonstrating increased challenge when completing with dominant LUE, dropping 1 cup and demonstrating looser gross grasp when picking up and manipulating cups.  Reviewed full grasp on cups to increase motor control and proximity of arm and/or body positioning to increase motor control when possible. Coordination: engaged in small peg board pattern replication with focus on [facility].  Pt completing with dominant LUE with no overt signs of tremor.  OT increased challenge to completing in-hand manipulation and translation of pegs one at a time from palm to finger tips to place into peg board.   Theraband: engaged in below exercises with red theraband with focus on controlled movement and sustained  hold 2-3 seconds at end range.   - Seated Elbow Flexion with Self-Anchored Resistance  - 1 x daily - 2 sets - 10 reps - Seated Elbow Extension with Self-Anchored Resistance  - 1 x daily - 2 sets - 10 reps - Seated Shoulder Extension with Resistance  - 1 x daily - 2 sets - 10 reps - Seated Shoulder Flexion with Resistance  - 1 x daily - 2 sets - 10 reps - Seated Shoulder Horizontal Abduction with Resistance  - 1 x daily - 2 sets - 10 reps  PATIENT EDUCATION: Education details: ongoing condition specific education Person educated: Patient Education method: Explanation and Handouts Education comprehension: verbalized understanding and needs further education  HOME EXERCISE PROGRAM: Access Code: [email or web address] URL: [email or web address] Date: 08/02/2023 Prepared by: [facility] - [facility]  GOALS: Goals reviewed  with patient? Yes  SHORT TERM GOALS: Target date: 07/22/23  Pt will be independent with HEP for improved fine motor control as needed for ADLs. Baseline: Goal status: MET  2.  Pt will verbalize understanding of improved body mechanics and use of AE for improved success with handwriting and self-feeding tasks.   Baseline:  Goal status: MET  3.  Pt will demonstrate improved UE functional use for ADLs as evidenced by increasing box/ blocks score by 5 blocks and improved motor control with dominant LUE. Baseline: R: 62, L: 52  Goal status: IN PROGRESS   LONG TERM GOALS: Target date: 08/19/23  Pt will write a paragraph for 5 mins with 95% legibility and no reports of fatigue with adaptive equipment or strategy as needed.  Baseline:  Goal status: MET - 08/09/23  2.  Pt will demonstrate improved motor control to be able to carry cup/plate across room and maintain steady grasp when serving communion. Baseline:  Goal status: MET - 08/09/23  3.  Pt will verbalize understanding of adaptive techniques/strategies to decrease tremors during ADLs/IADLs  Baseline:  Goal status: MET - 08/09/23  ASSESSMENT:  CLINICAL IMPRESSION: Pt recognizes improvements in legibility of handwriting and utilizing body mechanics and holding items in proximity to body for increased stability and control. Pt reporting improved grip and pinch strength with opening containers and picking up items.  Pt pleased with modifications and is able to incorporate into daily routine tasks and work tasks.  Pt is agreeable to d/c.  PERFORMANCE DEFICITS: in functional skills including ADLs, IADLs, coordination, strength, flexibility, Gross motor control, and UE functional use and psychosocial skills including coping strategies and environmental adaptation.    PLAN:  OT FREQUENCY: 1x/week  OT DURATION: 8 weeks (6 visits over 8 weeks due to upcoming trip)  PLANNED INTERVENTIONS: self care/ADL training, therapeutic exercise,  therapeutic activity, neuromuscular re-education, passive range of motion, functional mobility training, compression bandaging, moist heat, cryotherapy, patient/family education, psychosocial skills training, coping strategies training, and DME and/or AE instructions  RECOMMENDED OTHER SERVICES: NA  CONSULTED AND AGREED WITH PLAN OF CARE: Patient  PLAN FOR NEXT SESSION: D/C   Rosalio Loud, OTR/L 08/09/2023, 12:42 PM  [facility] at [facility] [address] [place], [place] [address] Phone # [phone] Fax # [phone]

## 2023-08-15 ENCOUNTER — Encounter: Payer: Self-pay | Admitting: Family Medicine

## 2023-08-15 ENCOUNTER — Ambulatory Visit (INDEPENDENT_AMBULATORY_CARE_PROVIDER_SITE_OTHER): Payer: Medicare Other | Admitting: Family Medicine

## 2023-08-15 VITALS — BP 118/64 | HR 81 | Temp 98.1°F | Ht 67.0 in | Wt 204.4 lb

## 2023-08-15 DIAGNOSIS — Z0000 Encounter for general adult medical examination without abnormal findings: Secondary | ICD-10-CM

## 2023-08-15 DIAGNOSIS — R739 Hyperglycemia, unspecified: Secondary | ICD-10-CM | POA: Diagnosis not present

## 2023-08-15 DIAGNOSIS — D649 Anemia, unspecified: Secondary | ICD-10-CM

## 2023-08-15 DIAGNOSIS — R251 Tremor, unspecified: Secondary | ICD-10-CM | POA: Diagnosis not present

## 2023-08-15 DIAGNOSIS — Z7189 Other specified counseling: Secondary | ICD-10-CM

## 2023-08-15 MED ORDER — OMEPRAZOLE 20 MG PO CPDR: 20.0000 mg | DELAYED_RELEASE_CAPSULE | Freq: Every day | ORAL | 3 refills | Status: DC

## 2023-08-15 NOTE — Patient Instructions (Signed)
Nonfasting labs in about 6 months.  Take care.  Glad to see you.

## 2023-08-15 NOTE — Progress Notes (Unsigned)
I have personally reviewed the Medicare Annual Wellness questionnaire and have noted 1. The patient's medical and social history 2. Their use of alcohol, tobacco or illicit drugs 3. Their current medications and supplements 4. The patient's functional ability including ADL's, fall risks, home safety risks and hearing or visual             impairment. 5. Diet and physical activities 6. Evidence for depression or mood disorders  The patients weight, height, BMI have been recorded in the chart and visual acuity is per eye clinic.  I have made referrals, counseling and provided education to the patient based review of the above and I have provided the pt with a written personalized care plan for preventive services.  Provider list updated- see scanned forms.  Routine anticipatory guidance given to patient.  See health maintenance. The possibility exists that previously documented standard health maintenance information may have been brought forward from a previous encounter into this note.  If needed, that same information has been updated to reflect the current situation based on today's encounter.    Flu Shingles PNA Tetanus Colon  Breast cancer screening Prostate cancer screening Advance directive Cognitive function addressed- see scanned forms- and if abnormal then additional documentation follows.   In addition to [facility], follow up visit for the below conditions:  Hyperglycemia.  Labs d/w pt.   Episodic inc in heart rate noted.  Off propranolol.  Inc in exercise with exertion.  Usually exercising at 100-115 BPM.  She is walking 1.25 miles in 30 min.  Not lightheaded, no syncope.  I asked her to recalibrate her cardiac monitor on her watch.    Tremor.  Improved some on primidone.  She went to OT.  She is still having trouble with handwriting.  She can still type.  She tried changing pens and using weighted glove/etc.    H/o anemia.  CBC d/w pt.  No anemic now.    Sister  dx'd with breast cancer, d/w pt.  She was asking about extra screening.    PMH and SH reviewed  Meds, vitals, and allergies reviewed.   ROS: Per HPI.  Unless specifically indicated otherwise in HPI, the patient denies:  General: fever. Eyes: acute vision changes ENT: sore throat Cardiovascular: chest pain Respiratory: SOB GI: vomiting GU: dysuria Musculoskeletal: acute back pain Derm: acute rash Neuro: acute motor dysfunction Psych: worsening mood Endocrine: polydipsia Heme: bleeding Allergy: hayfever  GEN: nad, alert and oriented HEENT: mucous membranes moist NECK: supple w/o LA CV: rrr. PULM: ctab, no inc wob ABD: soft, +bs EXT: no edema SKIN: no acute rash

## 2023-08-16 ENCOUNTER — Ambulatory Visit: Payer: Medicare Other | Admitting: Occupational Therapy

## 2023-08-16 LAB — CBC WITH DIFFERENTIAL/PLATELET
Basophils Absolute: 0 10*3/uL (ref 0.0–0.1)
Basophils Relative: 0.4 % (ref 0.0–3.0)
Eosinophils Absolute: 0.2 10*3/uL (ref 0.0–0.7)
Eosinophils Relative: 2.8 % (ref 0.0–5.0)
HCT: 39.6 % (ref 36.0–46.0)
Hemoglobin: 13.1 g/dL (ref 12.0–15.0)
Lymphocytes Relative: 36.9 % (ref 12.0–46.0)
Lymphs Abs: 2.4 10*3/uL (ref 0.7–4.0)
MCHC: 33 g/dL (ref 30.0–36.0)
MCV: 94.5 fL (ref 78.0–100.0)
Monocytes Absolute: 0.6 10*3/uL (ref 0.1–1.0)
Monocytes Relative: 9.7 % (ref 3.0–12.0)
Neutro Abs: 3.3 10*3/uL (ref 1.4–7.7)
Neutrophils Relative %: 50.2 % (ref 43.0–77.0)
Platelets: 258 10*3/uL (ref 150.0–400.0)
RBC: 4.19 Mil/uL (ref 3.87–5.11)
RDW: 13 % (ref 11.5–15.5)
WBC: 6.6 10*3/uL (ref 4.0–10.5)

## 2023-08-16 LAB — IRON: Iron: 97 ug/dL (ref 42–145)

## 2023-08-16 LAB — FERRITIN: Ferritin: 65.9 ng/mL (ref 10.0–291.0)

## 2023-08-17 ENCOUNTER — Telehealth: Payer: Self-pay | Admitting: Family Medicine

## 2023-08-17 DIAGNOSIS — Z803 Family history of malignant neoplasm of breast: Secondary | ICD-10-CM

## 2023-08-17 DIAGNOSIS — Z1239 Encounter for other screening for malignant neoplasm of breast: Secondary | ICD-10-CM

## 2023-08-17 NOTE — Assessment & Plan Note (Signed)
Labs d/w pt. D/w pt about diet and exercise.

## 2023-08-17 NOTE — Assessment & Plan Note (Signed)
Advance directive-daughter Shannon designated if patient were incapacitated. 

## 2023-08-17 NOTE — Telephone Encounter (Signed)
Please update patient.  She had typhoid vaccine prev.  Would get last/3rd dose of HAV/HBV vaccine when possible.  Wouldn't need malaria meds during the trip to [place].   Also, I am checking on extra breast imaging options in the meantime.  Thanks.

## 2023-08-17 NOTE — Assessment & Plan Note (Signed)
H/o, improved on iron. CBC d/w pt.  Not anemic now.  Had GI eval.  Would continue iron and recheck labs periodically.  She agrees.

## 2023-08-17 NOTE — Telephone Encounter (Signed)
Please check with the breast center.  Can they offer extra imaging in addition to mammogram?  Patient doesn't have new findings but both sisters have been dx'd with breast cancer.  Please let me know.  Thanks.

## 2023-08-17 NOTE — Assessment & Plan Note (Signed)
Improved some on primidone.  She went to OT.  She is still having trouble with handwriting.  She can still type.  She tried changing pens and using weighted glove/etc.  Would continue as is.

## 2023-08-17 NOTE — Assessment & Plan Note (Signed)
Flu prev done.  Shingles prev done.  PNA prev done.  Tetanus 2014 Colonoscopy 2024 Breast cancer screening 2023, d/w pt about options.  See following phone note.  Advance directive-daughter Carollee Herter designated if patient were incapacitated.  Cognitive function addressed- see scanned forms- and if abnormal then additional documentation follows.

## 2023-08-18 NOTE — Telephone Encounter (Signed)
Please let patient know that I put in the order for breast MRI at the breast center.  Please let me know if she is not able to get scheduled.  Thanks.

## 2023-08-18 NOTE — Telephone Encounter (Signed)
Called patient reviewed all information and repeated back to me.  She will get twinrix #3. She will call office if any further questions.

## 2023-08-19 ENCOUNTER — Telehealth: Payer: Self-pay | Admitting: *Deleted

## 2023-08-19 NOTE — Telephone Encounter (Signed)
Pt was advised by front desk

## 2023-08-19 NOTE — Telephone Encounter (Signed)
Pt will go to [facility] she will just need orders put in 6 months from now .

## 2023-08-19 NOTE — Telephone Encounter (Signed)
Orders are already in

## 2023-08-19 NOTE — Telephone Encounter (Signed)
LVM for patient to c/b and schedule.  

## 2023-08-19 NOTE — Telephone Encounter (Signed)
Per Dr. Para March pt just needs 6 month f/u Lab Only appt. Please schedule

## 2023-08-22 ENCOUNTER — Encounter: Payer: Self-pay | Admitting: *Deleted

## 2023-08-22 ENCOUNTER — Other Ambulatory Visit: Payer: Self-pay | Admitting: Family Medicine

## 2023-08-22 DIAGNOSIS — Z1231 Encounter for screening mammogram for malignant neoplasm of breast: Secondary | ICD-10-CM

## 2023-08-23 ENCOUNTER — Ambulatory Visit: Payer: Medicare Other | Admitting: Occupational Therapy

## 2023-08-30 ENCOUNTER — Ambulatory Visit: Payer: Medicare Other | Admitting: Occupational Therapy

## 2023-10-03 ENCOUNTER — Ambulatory Visit
Admission: RE | Admit: 2023-10-03 | Discharge: 2023-10-03 | Disposition: A | Discharge status: Home / Self Care | Payer: Medicare Other | Source: Ambulatory Visit | Attending: Family Medicine | Admitting: Family Medicine

## 2023-10-03 DIAGNOSIS — Z1231 Encounter for screening mammogram for malignant neoplasm of breast: Secondary | ICD-10-CM

## 2023-10-05 ENCOUNTER — Telehealth: Payer: Self-pay | Admitting: Family Medicine

## 2023-10-05 NOTE — Telephone Encounter (Signed)
Patient returned call regarding her mammogram results.I relayed message to her from Dr Para March regarding her normal mammogram.She verbalized understanding

## 2023-10-16 ENCOUNTER — Other Ambulatory Visit: Payer: Self-pay | Admitting: Family Medicine

## 2023-10-16 DIAGNOSIS — Z1239 Encounter for other screening for malignant neoplasm of breast: Secondary | ICD-10-CM

## 2023-12-23 ENCOUNTER — Ambulatory Visit
Admission: RE | Admit: 2023-12-23 | Discharge: 2023-12-23 | Disposition: A | Discharge status: Home / Self Care | Payer: Medicare Other | Source: Ambulatory Visit | Attending: Family Medicine | Admitting: Family Medicine

## 2023-12-23 DIAGNOSIS — Z1239 Encounter for other screening for malignant neoplasm of breast: Secondary | ICD-10-CM

## 2023-12-23 MED ORDER — GADOPICLENOL 0.5 MMOL/ML IV SOLN
9.0000 mL | Freq: Once | INTRAVENOUS | Status: AC | PRN
  Administered 2023-12-23: 9 mL via INTRAVENOUS

## 2023-12-25 ENCOUNTER — Other Ambulatory Visit: Payer: Self-pay | Admitting: Family Medicine

## 2023-12-25 DIAGNOSIS — N63 Unspecified lump in unspecified breast: Secondary | ICD-10-CM

## 2023-12-28 ENCOUNTER — Other Ambulatory Visit: Payer: Self-pay | Admitting: Family Medicine

## 2023-12-28 DIAGNOSIS — N63 Unspecified lump in unspecified breast: Secondary | ICD-10-CM

## 2023-12-28 NOTE — Progress Notes (Unsigned)
 [facility] [address] [place]. [place] [address] [phone]       OFFICE FOLLOW UP NOTE  Theresa Eaton Date of Birth:  1951-02-28 Medical Record Number:  [id number]    Primary neurologist: Dr. Frances Furbish Reason for visit: Essential tremor    SUBJECTIVE:   CHIEF COMPLAINT:  No chief complaint on file.   HPI:    Update 12/29/2023 JM: Patient returns for 67-month follow-up.  At prior visit, noted worsening tremor after discontinuing propranolol due to persistent diarrhea and bradycardia.  Primidone dosage gradually increased to a total of 100 mg daily and was referred to OT for hand stiffness and tremor.    Completed 5 sessions of OT with improvement of motor control and some improvement of tremor.    History provided for reference purposes only Update 06/20/2023 JM: Patient returns for follow-up visit.  Reports discontinuing propranolol back in June per GI recommendations as they were concerned this was causing persistent diarrhea, did not notice improvement of diarrhea after stopping but did note improvement of heart rate.  She remains on primidone 50 mg twice daily, denies side effects. Reports tremors have slightly worsened since stopping propranolol and continues to have hand stiffness.  Previously discussed working with OT and now interested in pursuing as tremors and stiffness can interfere with writing.  Update 11/24/2022 JM: Patient returns for 47-month follow-up regarding essential tremor.  At prior visit, recommended continuation of propranolol 10 mg twice daily and increase primidone gradually to 50 mg twice daily.  She reports improvement of her tremor since increasing primidone dosage, tolerating well. She also remains on propranolol.  Unfortunately, she has not been able to return back to playing the viola more so due to hand stiffness and agility. Does participate in Silver sneakers classes which does offer fine motor exercises which she  participates with. Notes improvement of her writing since increased dose. Tremors do not interfere with any other activity or functioning. Denies tremor in neck/voice.  Consult visit 08/11/2022 Dr. Frances Furbish: Theresa Eaton is a 73 year old female with an underlying medical history of reflux disease, hyperlipidemia, osteopenia, diabetes, arthritis, edema of both lower extremities, and obesity, who reports a bilateral upper extremity tremor for the past 18 mo.  I reviewed your office note from 08/06/2022.  She has been on Mysoline 50 mg strength and also takes propranolol 10 mg twice daily.  She has been on these medications for the past several months, for started on low-dose propranolol twice daily.  She then started Mysoline 25 mg at night and then increased it to the current dose of 50 mg at night.  She tolerates the medication, denies any sleepiness from it.  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  Her father had hand tremors.  She has 2 sisters, neither 1 with tremors, her 3 children have no tremors.  She has limited her caffeine, generally drinks mostly decaf coffee.  She drinks alcohol in the form of wine, 1 glass about 3 days out of the week.  She has noticed a connection to stress exacerbating her tremors.  She has been sleeping fairly well.  She does have a history of snoring.  She does have sleep disruption from nocturia about 2-3 times, depending on how bad her leg swelling is.  She has bilateral leg edema.  She is a non-smoker.  She tries to hydrate  well with water. She had recent blood work through your office on 08/06/2022 and I reviewed the results: Vitamin B-12 was 551, ferritin low at 10, A1c was 6.4.  On 07/30/2022 her TSH was 4.18, vitamin D borderline at 31.9, total cholesterol 222, triglycerides 128, HDL 50.8, LDL elevated at 161.    ROS:   14 system review of systems  performed and negative with exception of those listed in HPI  PMH:  Past Medical History:  Diagnosis Date   Arthritis    bursitis in lt. and rt.   Chicken pox    Diabetes mellitus without complication (HCC)    history of no longer since 2015   GERD (gastroesophageal reflux disease)    Hyperlipidemia    in the past no longer has   Osteopenia    started on fosamax 2013   Tremor     PSH:  Past Surgical History:  Procedure Laterality Date   COLONOSCOPY  10/25/2005   [place]/Normal     COLONOSCOPY WITH ESOPHAGOGASTRODUODENOSCOPY (EGD)  11/12/2022   Laser closure of greater saphenous vein, Left  2015   TUBAL LIGATION      Social History:  Social History   Socioeconomic History   Marital status: Married    Spouse name: Not on file   Number of children: Not on file   Years of education: Not on file   Highest education level: Master's degree (e.g., MA, MS, MEng, MEd, MSW, MBA)  Occupational History   Not on file  Tobacco Use   Smoking status: Never   Smokeless tobacco: Never  Vaping Use   Vaping status: Never Used  Substance and Sexual Activity   Alcohol use: Yes    Alcohol/week: 4.0 standard drinks of alcohol    Types: 4 Glasses of wine per week    Comment: 1 glass of wine with a meal 4x/wk   Drug use: Never   Sexual activity: Not Currently    Birth control/protection: Post-menopausal  Other Topics Concern   Not on file  Social History Narrative   MDIV, Manfred Shirts priest   Married 1973   3 kids- her daughter is a family medicine MD (med school at [employer]).   Social Drivers of Corporate investment banker Strain: Low Risk  (08/11/2023)   Overall Financial Resource Strain (CARDIA)    Difficulty of Paying Living Expenses: Not hard at all  Food Insecurity: No Food Insecurity (08/11/2023)   Hunger Vital Sign    Worried About Running Out of Food in the Last Year: Never true    Ran Out of Food in the Last Year: Never true  Transportation Needs: No Transportation Needs  (08/11/2023)   PRAPARE - Administrator, Civil Service (Medical): No    Lack of Transportation (Non-Medical): No  Physical Activity: Insufficiently Active (08/11/2023)   Exercise Vital Sign    Days of Exercise per Week: 7 days    Minutes of Exercise per Session: 20 min  Stress: No Stress Concern Present (08/11/2023)   [employer] of Occupational Health - Occupational Stress Questionnaire    Feeling of Stress : Not at all  Social Connections: Socially Integrated (08/11/2023)   Social Connection and Isolation Panel [NHANES]    Frequency of Communication with Friends and Family: More than three times a week    Frequency of Social Gatherings with Friends and Family: Once a week    Attends Religious Services: More than 4 times per year    Active Member of [employer]  or Organizations: Yes    Attends Banker Meetings: More than 4 times per year    Marital Status: Married  Catering manager Violence: Not At Risk (04/15/2021)   Humiliation, Afraid, Rape, and Kick questionnaire    Fear of Current or Ex-Partner: No    Emotionally Abused: No    Physically Abused: No    Sexually Abused: No    Family History:  Family History  Problem Relation Age of Onset   Hyperlipidemia Mother    Dementia Mother    Cancer Father        prostate   Osteoporosis Father    Tremor Father    Cancer Sister        breast   Diabetes Sister    Breast cancer Sister    Cancer Sister    Breast cancer Sister    Osteoporosis Brother    Diabetes Maternal Grandmother    Heart disease Maternal Grandfather    Heart disease Paternal Grandfather    Osteoporosis Maternal Aunt    Colon cancer Neg Hx    Colon polyps Neg Hx    Esophageal cancer Neg Hx    Stomach cancer Neg Hx    Rectal cancer Neg Hx    Parkinson's disease Neg Hx     Medications:   Current Outpatient Medications on File Prior to Visit  Medication Sig Dispense Refill   Cholecalciferol (VITAMIN D3) 50 MCG (2000 UT) capsule  Take 1 capsule (2,000 Units total) by mouth daily.     Iron, Ferrous Sulfate, 325 (65 Fe) MG TABS Take 325 mg by mouth daily.     Multiple Vitamin (MULTIVITAMIN) tablet Take 1 tablet by mouth daily.     Multiple Vitamins-Minerals (CENTRUM SILVER ADULT 50+ PO) Take 1 tablet by mouth daily.     omeprazole (PRILOSEC) 20 MG capsule Take 1 capsule (20 mg total) by mouth daily. 90 capsule 3   primidone (MYSOLINE) 50 MG tablet Take 1 tablet (50 mg total) by mouth in the morning AND 2 tablets (100 mg total) every evening. 90 tablet 5   Probiotic Product (ALIGN) 4 MG CAPS Take 1 capsule by mouth 1 day or 1 dose.     No current facility-administered medications on file prior to visit.    Allergies:   Allergies  Allergen Reactions   Statins     Leg weakness      OBJECTIVE:  Physical Exam  There were no vitals filed for this visit.  There is no height or weight on file to calculate BMI. No results found.   General: well developed, well nourished, very pleasant elderly Caucasian female, seated, in no evident distress Head: head normocephalic and atraumatic.   Neck: supple with no carotid or supraclavicular bruits Cardiovascular: regular rate and rhythm, no murmurs Musculoskeletal: no deformity Skin:  no rash/petichiae Vascular:  Normal pulses all extremities   Neurologic Exam Mental Status: Awake and fully alert.  Fluent speech and language.  Oriented to place and time. Recent and remote memory intact. Attention span, concentration and fund of knowledge appropriate. Mood and affect appropriate.  Cranial Nerves: Pupils equal, briskly reactive to light. Extraocular movements full without nystagmus. Visual fields full to confrontation. Hearing intact. Facial sensation intact. Face, tongue, palate moves normally and symmetrically.  Motor: Normal bulk and tone. Normal strength in all tested extremity muscles. Unable to appreciate action/postural tremor on exam today.  No resting tremor.  No  lower extremity tremor. Sensory.: intact to touch , pinprick , position  and vibratory sensation.  Coordination: Rapid alternating movements normal in all extremities. Finger-to-nose and heel-to-shin performed accurately bilaterally. Gait and Station: Arises from chair without difficulty. Stance is normal. Gait demonstrates normal stride length and balance without use of AD.  Reflexes: 1+ and symmetric. Toes downgoing.         ASSESSMENT/PLAN: Theresa Eaton is a 73 y.o. year old female    1.  Essential tremor -Currently on primidone 50mg  BID, reports worsening tremor since stopping propranolol, recommend continuing 50 mg AM and increasing evening dose of primidone to 75mg  for 1 week (1.5 tabs) then increase to 100mg  (2 tabs) -discussed potential side effects and advised to call with any difficulty tolerating -Intolerant to propranolol (lowered HR, fatigue) -Referral placed to OT for hand stiffness and tremor    Follow up in 6 months or call earlier if needed    CC:  PCP: Joaquim Nam, MD    I spent 25 minutes of face-to-face and non-face-to-face time with patient.  This included previsit chart review, lab review, study review, order entry, electronic health record documentation, patient education regarding above diagnoses and treatment plan and answered all other questions to patient satisfaction   Ihor Austin, [facility]  [facility] [address] [place], [place] [address]  Phone [phone] Fax [phone] Note: This document was prepared with digital dictation and possible smart phrase technology. Any transcriptional errors that result from this process are unintentional.

## 2023-12-29 ENCOUNTER — Encounter: Payer: Self-pay | Admitting: Adult Health

## 2023-12-29 ENCOUNTER — Ambulatory Visit: Payer: Medicare Other | Admitting: Adult Health

## 2023-12-29 VITALS — BP 131/71 | HR 68 | Ht 67.0 in | Wt 205.0 lb

## 2023-12-29 DIAGNOSIS — M25641 Stiffness of right hand, not elsewhere classified: Secondary | ICD-10-CM | POA: Diagnosis not present

## 2023-12-29 DIAGNOSIS — G250 Essential tremor: Secondary | ICD-10-CM

## 2023-12-29 DIAGNOSIS — M25642 Stiffness of left hand, not elsewhere classified: Secondary | ICD-10-CM | POA: Diagnosis not present

## 2023-12-29 MED ORDER — PRIMIDONE 50 MG PO TABS: ORAL_TABLET | ORAL | 3 refills | Status: DC

## 2023-12-29 NOTE — Patient Instructions (Addendum)
 Your Plan:  Continue primidone 50mg  AM and 100mg  PM  Continue exercises at home as advised by therapy     Follow pu in 6 months or call earlier if needed     Thank you for coming to see [place] at [facility]. I hope we have been able to provide you high quality care today.  You may receive a patient satisfaction survey over the next few weeks. We would appreciate your feedback and comments so that we may continue to improve ourselves and the health of our patients.

## 2024-01-05 ENCOUNTER — Other Ambulatory Visit: Payer: Self-pay | Admitting: Family Medicine

## 2024-01-05 DIAGNOSIS — N63 Unspecified lump in unspecified breast: Secondary | ICD-10-CM

## 2024-01-10 ENCOUNTER — Ambulatory Visit
Admission: RE | Admit: 2024-01-10 | Discharge: 2024-01-10 | Disposition: A | Discharge status: Home / Self Care | Source: Ambulatory Visit | Attending: Family Medicine | Admitting: Family Medicine

## 2024-01-10 ENCOUNTER — Encounter

## 2024-01-10 ENCOUNTER — Other Ambulatory Visit

## 2024-01-10 DIAGNOSIS — N63 Unspecified lump in unspecified breast: Secondary | ICD-10-CM

## 2024-01-22 ENCOUNTER — Other Ambulatory Visit: Payer: Self-pay | Admitting: Adult Health

## 2024-04-21 ENCOUNTER — Encounter: Payer: Self-pay | Admitting: Family Medicine

## 2024-04-23 ENCOUNTER — Other Ambulatory Visit: Payer: Self-pay | Admitting: Family Medicine

## 2024-04-23 DIAGNOSIS — M549 Dorsalgia, unspecified: Secondary | ICD-10-CM

## 2024-04-24 ENCOUNTER — Ambulatory Visit: Admitting: Family Medicine

## 2024-05-01 ENCOUNTER — Other Ambulatory Visit: Payer: Self-pay | Admitting: Family Medicine

## 2024-05-01 MED ORDER — MELOXICAM 15 MG PO TABS: 15.0000 mg | ORAL_TABLET | Freq: Every day | ORAL | 1 refills | Status: DC

## 2024-05-14 ENCOUNTER — Ambulatory Visit: Admitting: Physical Therapy

## 2024-06-28 ENCOUNTER — Encounter: Payer: Self-pay | Admitting: Family Medicine

## 2024-07-01 ENCOUNTER — Other Ambulatory Visit: Payer: Self-pay | Admitting: Family Medicine

## 2024-07-01 MED ORDER — COVID-19 MRNA VAC-TRIS(PFIZER) 30 MCG/0.3ML IM SUSY
0.3000 mL | PREFILLED_SYRINGE | Freq: Once | INTRAMUSCULAR | 0 refills | Status: AC
Start: 2024-07-01 — End: 2024-07-01

## 2024-07-09 ENCOUNTER — Ambulatory Visit: Admitting: Adult Health

## 2024-07-10 ENCOUNTER — Ambulatory Visit: Admitting: Family Medicine

## 2024-07-10 ENCOUNTER — Encounter: Payer: Self-pay | Admitting: Family Medicine

## 2024-07-10 VITALS — BP 110/70 | HR 63 | Temp 98.3°F | Wt 186.2 lb

## 2024-07-10 DIAGNOSIS — M5430 Sciatica, unspecified side: Secondary | ICD-10-CM | POA: Diagnosis not present

## 2024-07-10 MED ORDER — PRIMIDONE 50 MG PO TABS: ORAL_TABLET | ORAL | Status: DC

## 2024-07-10 MED ORDER — GABAPENTIN 100 MG PO CAPS: 100.0000 mg | ORAL_CAPSULE | Freq: Three times a day (TID) | ORAL | 1 refills | Status: DC | PRN

## 2024-07-10 NOTE — Progress Notes (Unsigned)
 She was improved at the last OV with Dr. Bonner.  Prev back injection didn't help but she was doing well enough with meloxicam  and PT.    Then had more pain with PT in the meantime.  Had bilateral pain, ie B sciatica.  Pain sitting >30 min.  She can stand and walk.  L>R pain now but she is getting some better recently.    D/w pt about options.  She can walk in the AMs now.    Meds, vitals, and allergies reviewed.   ROS: Per HPI unless specifically indicated in ROS section   Rrr Ctab L SLR positive

## 2024-07-10 NOTE — Patient Instructions (Addendum)
 If you continue to have better pain control, then I would gradually return to PT.  It would be reasonable to consider follow up imaging later on.   I would repeat the bone density test after October.   If tylenol  and meloxicam  don't help, then you could try adding on gabapentin , starting with 100mg  at night.   Take care.  Glad to see you. Update me as needed.

## 2024-07-11 NOTE — Assessment & Plan Note (Signed)
 Discussed options. If she continues to have better pain control, then I would gradually return to PT.  It would be reasonable to consider follow up imaging later on.   I would repeat the bone density test after October.  Discussed.  If tylenol  and meloxicam  don't help, then she could try adding on gabapentin , starting with 100mg  at night.  Routine cautions given to patient. Update me as needed.  She agrees with plan.

## 2024-07-29 ENCOUNTER — Telehealth: Payer: Self-pay | Admitting: Family Medicine

## 2024-07-29 ENCOUNTER — Encounter: Payer: Self-pay | Admitting: Family Medicine

## 2024-07-29 DIAGNOSIS — N63 Unspecified lump in unspecified breast: Secondary | ICD-10-CM

## 2024-07-29 NOTE — Telephone Encounter (Signed)
 Please update patient and update referrals.  I will put in the order for the follow-up MRI breast based on her previous ultrasound.  Have her let us  know if she cannot get scheduled.  Thanks.

## 2024-07-31 ENCOUNTER — Ambulatory Visit (INDEPENDENT_AMBULATORY_CARE_PROVIDER_SITE_OTHER)

## 2024-07-31 VITALS — BP 110/70 | Ht 67.0 in | Wt 176.0 lb

## 2024-07-31 DIAGNOSIS — Z0000 Encounter for general adult medical examination without abnormal findings: Secondary | ICD-10-CM | POA: Diagnosis not present

## 2024-07-31 NOTE — Telephone Encounter (Signed)
 Left voicemail for patient to return call to office.

## 2024-07-31 NOTE — Patient Instructions (Signed)
 [name],  Thank you for taking the time for your Medicare Wellness Visit. I appreciate your continued commitment to your health goals. Please review the care plan we discussed, and feel free to reach out if I can assist you further.  Medicare recommends these wellness visits once per year to help you and your care team stay ahead of potential health issues. These visits are designed to focus on prevention, allowing your provider to concentrate on managing your acute and chronic conditions during your regular appointments.  Please note that Annual Wellness Visits do not include a physical exam. Some assessments may be limited, especially if the visit was conducted virtually. If needed, we may recommend a separate in-person follow-up with your provider.  Ongoing Care Seeing your primary care provider every 3 to 6 months helps us  monitor your health and provide consistent, personalized care.   Referrals If a referral was made during today's visit and you haven't received any updates within two weeks, please contact the referred provider directly to check on the status.  Recommended Screenings:  Health Maintenance  Topic Date Due   Flu Shot  05/25/2024   COVID-19 Vaccine (11 - 2025-26 season) 08/27/2024   Medicare Annual Wellness Visit  07/31/2025   DTaP/Tdap/Td vaccine (3 - Td or Tdap) 08/15/2033   Pneumococcal Vaccine for age over 72  Completed   DEXA scan (bone density measurement)  Completed   Hepatitis C Screening  Completed   Zoster (Shingles) Vaccine  Completed   Meningitis B Vaccine  Aged Out   Hepatitis B Vaccine  Discontinued   Breast Cancer Screening  Discontinued   Colon Cancer Screening  Discontinued       07/31/2024   10:28 AM  Advanced Directives  Does Patient Have a Medical Advance Directive? Yes  Type of Estate agent of [place];Living will  Does patient want to make changes to medical advance directive? No - Patient declined  Copy of  Healthcare Power of Attorney in Chart? Yes - validated most recent copy scanned in chart (See row information)   Advance Care Planning is important because it: Ensures you receive medical care that aligns with your values, goals, and preferences. Provides guidance to your family and loved ones, reducing the emotional burden of decision-making during critical moments.  Vision: Annual vision screenings are recommended for early detection of glaucoma, cataracts, and diabetic retinopathy. These exams can also reveal signs of chronic conditions such as diabetes and high blood pressure.  Dental: Annual dental screenings help detect early signs of oral cancer, gum disease, and other conditions linked to overall health, including heart disease and diabetes.  Please see the attached documents for additional preventive care recommendations.

## 2024-07-31 NOTE — Progress Notes (Signed)
 Because this visit was a virtual/telehealth visit,  certain criteria was not obtained, such a blood pressure, CBG if applicable, and timed get up and go. Any medications not marked as taking were not mentioned during the medication reconciliation part of the visit. Any vitals not documented were not able to be obtained due to this being a telehealth visit or patient was unable to self-report a recent blood pressure reading due to a lack of equipment at home via telehealth. Vitals that have been documented are verbally provided by the patient.   This visit was performed by a medical professional under my direct supervision. I was immediately available for consultation/collaboration. I have reviewed and agree with the Annual Wellness Visit documentation.  Subjective:   Theresa Eaton is a 73 y.o. who presents for a Medicare Wellness preventive visit.  As a reminder, Annual Wellness Visits don't include a physical exam, and some assessments may be limited, especially if this visit is performed virtually. We may recommend an in-person follow-up visit with your provider if needed.  Visit Complete: Virtual I connected with  [name] on 07/31/24 by a video and audio enabled telemedicine application and verified that I am speaking with the correct person using two identifiers.  Patient Location: Home  Provider Location: Home Office  I discussed the limitations of evaluation and management by telemedicine. The patient expressed understanding and agreed to proceed.  Vital Signs: Because this visit was a virtual/telehealth visit, some criteria may be missing or patient reported. Any vitals not documented were not able to be obtained and vitals that have been documented are patient reported.   Persons Participating in Visit: Patient.  AWV Questionnaire: Yes: Patient Medicare AWV questionnaire was completed by the patient on 07/28/2024; I have confirmed that all information answered by patient is  correct and no changes since this date.  Cardiac Risk Factors include: advanced age (>73men, >30 women)     Objective:    Today's Vitals   07/28/24 0714 07/31/24 1029  BP:  110/70  Weight:  176 lb (79.8 kg)  Height:  5' 7 (1.702 m)  PainSc: 4     Body mass index is 27.57 kg/m.     07/31/2024   10:28 AM 06/23/2023    2:53 PM 01/16/2022    6:33 PM 04/15/2021    2:46 PM 09/10/2019    3:41 PM 09/04/2018    2:08 PM 08/23/2017    1:20 PM  Advanced Directives  Does Patient Have a Medical Advance Directive? Yes Yes No Yes Yes Yes  Yes   Type of Estate agent of [place];Living will Living will;Healthcare Power of [employer] Power of [place];Living will Healthcare Power of [place];Living will Healthcare Power of [place];Living will Healthcare Power of [place];Living will  Does patient want to make changes to medical advance directive? No - Patient declined        Copy of Healthcare Power of Attorney in Chart? Yes - validated most recent copy scanned in chart (See row information)   Yes - validated most recent copy scanned in chart (See row information) No - copy requested No - copy requested    Would patient like information on creating a medical advance directive?   No - Patient declined         Data saved with a previous flowsheet row definition    Current Medications (verified) Outpatient Encounter Medications as of 07/31/2024  Medication Sig   Cholecalciferol (VITAMIN D3) 50 MCG (2000 UT) capsule  Take 1 capsule (2,000 Units total) by mouth daily.   Iron , Ferrous Sulfate , 325 (65 Fe) MG TABS Take 325 mg by mouth daily.   meloxicam  (MOBIC ) 15 MG tablet Take 1 tablet (15 mg total) by mouth daily. With food.   Multiple Vitamins-Minerals (CENTRUM SILVER ADULT 50+ PO) Take 1 tablet by mouth daily.   primidone  (MYSOLINE ) 50 MG tablet Take 1 tablet (50 mg total) by mouth in the morning AND 1 tablet (50 mg total) at bedtime.   gabapentin  (NEURONTIN ) 100 MG  capsule Take 1-2 capsules (100-200 mg total) by mouth 3 (three) times daily as needed (for pain). (Patient not taking: Reported on 07/31/2024)   No facility-administered encounter medications on file as of 07/31/2024.    Allergies (verified) Statins   History: Past Medical History:  Diagnosis Date   Anemia 08/06/2022   Arthritis    bursitis in lt. and rt.   Cataract 03/25/2021   Chicken pox    Diabetes mellitus without complication (HCC)    history of no longer since 2015   GERD (gastroesophageal reflux disease)    Hyperlipidemia    in the past no longer has   Osteopenia    started on fosamax  2013   Osteoporosis 2000   Tremor    Past Surgical History:  Procedure Laterality Date   COLONOSCOPY  10/25/2005   [place]/Normal     COLONOSCOPY WITH ESOPHAGOGASTRODUODENOSCOPY (EGD)  11/12/2022   Laser closure of greater saphenous vein, Left  2015   TUBAL LIGATION     Family History  Problem Relation Age of Onset   Hyperlipidemia Mother    Dementia Mother    Cancer Father        prostate   Osteoporosis Father    Tremor Father    Cancer Sister        breast   Diabetes Sister    Breast cancer Sister    Cancer Sister    Breast cancer Sister    Osteoporosis Brother    Diabetes Maternal Grandmother    Heart disease Maternal Grandfather    Heart disease Paternal Grandfather    Osteoporosis Maternal Aunt    Cancer Sister    Colon cancer Neg Hx    Colon polyps Neg Hx    Esophageal cancer Neg Hx    Stomach cancer Neg Hx    Rectal cancer Neg Hx    Parkinson's disease Neg Hx    Social History   Socioeconomic History   Marital status: Married    Spouse name: Not on file   Number of children: Not on file   Years of education: Not on file   Highest education level: Professional school degree (e.g., MD, DDS, DVM, JD)  Occupational History   Not on file  Tobacco Use   Smoking status: Never   Smokeless tobacco: Never  Vaping Use   Vaping status: Never Used  Substance and  Sexual Activity   Alcohol use: Yes    Alcohol/week: 4.0 standard drinks of alcohol    Types: 4 Glasses of wine per week    Comment: 1 glass of wine with a meal 4x/wk   Drug use: Never   Sexual activity: Not Currently    Birth control/protection: Post-menopausal  Other Topics Concern   Not on file  Social History Narrative   MDIV, [place]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   Social Drivers of Dispensing optician  Resource Strain: Low Risk  (07/31/2024)   Overall Financial Resource Strain (CARDIA)    Difficulty of Paying Living Expenses: Not hard at all  Food Insecurity: No Food Insecurity (07/31/2024)   Hunger Vital Sign    Worried About Running Out of Food in the Last Year: Never true    Ran Out of Food in the Last Year: Never true  Transportation Needs: No Transportation Needs (07/31/2024)   PRAPARE - Administrator, Civil Service (Medical): No    Lack of Transportation (Non-Medical): No  Physical Activity: Insufficiently Active (07/31/2024)   Exercise Vital Sign    Days of Exercise per Week: 6 days    Minutes of Exercise per Session: 20 min  Stress: Stress Concern Present (07/31/2024)   [employer] of Occupational Health - Occupational Stress Questionnaire    Feeling of Stress: To some extent  Social Connections: Socially Integrated (07/31/2024)   Social Connection and Isolation Panel    Frequency of Communication with Friends and Family: More than three times a week    Frequency of Social Gatherings with Friends and Family: Once a week    Attends Religious Services: More than 4 times per year    Active Member of [employer] or Organizations: Yes    Attends Engineer, structural: More than 4 times per year    Marital Status: Married    Tobacco Counseling Counseling given: Not Answered    Clinical Intake:  Pre-visit preparation completed: Yes  Pain : 0-10 Pain Score: 4  Pain Type: Neuropathic  pain Pain Descriptors / Indicators: Aching Pain Onset: Today Pain Frequency: Several days a week     BMI - recorded: 27.57 Nutritional Status: BMI 25 -29 Overweight Nutritional Risks: None Diabetes: No  Lab Results  Component Value Date   HGBA1C 5.9 08/03/2023   HGBA1C 6.4 (H) 08/06/2022   HGBA1C 5.8 (A) 01/14/2020     How often do you need to have someone help you when you read instructions, pamphlets, or other written materials from your doctor or pharmacy?: 1 - Never  Interpreter Needed?: No  Information entered by :: Viktor Philipp,CMA   Activities of Daily Living     07/28/2024    7:14 AM  In your present state of health, do you have any difficulty performing the following activities:  Hearing? 0  Vision? 0  Difficulty concentrating or making decisions? 0  Walking or climbing stairs? 0  Dressing or bathing? 0  Doing errands, shopping? 0  Preparing Food and eating ? N  Using the Toilet? N  In the past six months, have you accidently leaked urine? N  Do you have problems with loss of bowel control? N  Managing your Medications? N  Managing your Finances? N  Housekeeping or managing your Housekeeping? N    Patient Care Team: Cleatus Arlyss RAMAN, MD as PCP - General (Family Medicine)  I have updated your Care Teams any recent Medical Services you may have received from other providers in the past year.     Assessment:   This is a routine wellness examination for [place].  Hearing/Vision screen Hearing Screening - Comments:: Patient has no difficulties Vision Screening - Comments:: Patient wears glasses    Goals Addressed             This Visit's Progress    Patient Stated       Patient would like to relieve her sciatica        Depression Screen  07/31/2024   10:33 AM 07/10/2024    3:02 PM 08/15/2023    2:33 PM 08/06/2022    3:52 PM 04/15/2021    2:48 PM 03/05/2021   10:31 AM 09/10/2019    3:43 PM  PHQ 2/9 Scores  PHQ - 2 Score 0 0 0 0 0  0 0  PHQ- 9 Score 0 0 0  0 0 0    Fall Risk     07/31/2024   10:31 AM 07/28/2024    7:14 AM 07/10/2024    3:01 PM 08/15/2023    2:33 PM 08/06/2022    3:52 PM  Fall Risk   Falls in the past year? 0 0 0 0 1  Number falls in past yr: 0  0 0 0  Injury with Fall? 0  0 0 1  Risk for fall due to : No Fall Risks  No Fall Risks No Fall Risks History of fall(s)  Follow up Falls evaluation completed  Falls evaluation completed  Falls evaluation completed      Data saved with a previous flowsheet row definition    MEDICARE RISK AT HOME:  Medicare Risk at Home Any stairs in or around the home?: (Patient-Rptd) Yes If so, are there any without handrails?: (Patient-Rptd) No Home free of loose throw rugs in walkways, pet beds, electrical cords, etc?: (Patient-Rptd) No Adequate lighting in your home to reduce risk of falls?: (Patient-Rptd) Yes Life alert?: (Patient-Rptd) No Use of a cane, walker or w/c?: (Patient-Rptd) No Grab bars in the bathroom?: (Patient-Rptd) Yes Shower chair or bench in shower?: (Patient-Rptd) No Elevated toilet seat or a handicapped toilet?: (Patient-Rptd) No  TIMED UP AND GO:  Was the test performed?  No  Cognitive Function: 6CIT completed    04/15/2021    2:56 PM 09/10/2019    3:44 PM 09/04/2018   11:53 AM 06/30/2017    2:51 PM  MMSE - Mini Mental State Exam  Not completed: Refused     Orientation to time  5 5 5    Orientation to Place  5 5 5    Registration  3 3 3    Attention/ Calculation  5 0 0   Recall  3 3 3    Language- name 2 objects   0 0   Language- repeat  1 1 1   Language- follow 3 step command   3 3   Language- read & follow direction   0 0   Write a sentence   0 0   Copy design   0 0   Total score   20 20      Data saved with a previous flowsheet row definition        07/31/2024   10:33 AM  6CIT Screen  What Year? 0 points  What month? 0 points  What time? 0 points  Count back from 20 0 points  Months in reverse 0 points  Repeat phrase 0  points  Total Score 0 points    Immunizations Immunization History  Administered Date(s) Administered   Fluad Quad(high Dose 65+) 07/11/2019, 07/29/2022   Hep A / Hep B 07/10/2018, 08/15/2018, 08/24/2023   INFLUENZA, HIGH DOSE SEASONAL PF 07/03/2018, 07/27/2023   Influenza,inj,Quad PF,6+ Mos 08/23/2013, 09/16/2014   Influenza,inj,quad, With Preservative 07/29/2022   Influenza-Unspecified 07/25/2016, 07/03/2018, 07/20/2021   Moderna Covid-19 Fall Seasonal Vaccine 72yrs & older 08/27/2022   PFIZER Comirnaty(Gray Top)Covid-19 Tri-Sucrose Vaccine 02/23/2021   PFIZER(Purple Top)SARS-COV-2 Vaccination 12/06/2019, 12/31/2019, 07/25/2020, 02/23/2021   PNEUMOCOCCAL CONJUGATE-20 03/16/2022  Pfizer Covid Bivalent Pediatric Vaccine(75mos to <16yrs) 07/20/2021   Pfizer Covid-19 Vaccine Bivalent Booster 71yrs & up 07/20/2021, 03/16/2022   Pfizer(Comirnaty)Fall Seasonal Vaccine 12 years and older 06/21/2023, 07/02/2024   Pneumococcal Conjugate-13 09/16/2014   Pneumococcal Polysaccharide-23 01/27/2016   Respiratory Syncytial Virus Vaccine,Recomb Aduvanted(Arexvy) 06/29/2022   Td 10/25/2012   Tdap 08/16/2023   Typhoid Live 06/30/2017   Unspecified SARS-COV-2 Vaccination 08/27/2022   Zoster Recombinant(Shingrix) 11/17/2021, 01/16/2022   Zoster, Live 08/27/2003    Screening Tests Health Maintenance  Topic Date Due   Influenza Vaccine  05/25/2024   COVID-19 Vaccine (11 - 2025-26 season) 08/27/2024   Medicare Annual Wellness (AWV)  07/31/2025   DTaP/Tdap/Td (3 - Td or Tdap) 08/15/2033   Pneumococcal Vaccine: 50+ Years  Completed   DEXA SCAN  Completed   Hepatitis C Screening  Completed   Zoster Vaccines- Shingrix  Completed   Meningococcal B Vaccine  Aged Out   Hepatitis B Vaccines 19-59 Average Risk  Discontinued   Mammogram  Discontinued   Colonoscopy  Discontinued    Health Maintenance Items Addressed:patient declined   Additional Screening:  Vision Screening: Recommended annual  ophthalmology exams for early detection of glaucoma and other disorders of the eye. Is the patient up to date with their annual eye exam?  Yes  Who is the provider or what is the name of the office in which the patient attends annual eye exams? Patient see Dr. Coralee .  Dental Screening: Recommended annual dental exams for proper oral hygiene  Community Resource Referral / Chronic Care Management: CRR required this visit?  No   CCM required this visit?  No   Plan:    I have personally reviewed and noted the following in the patient's chart:   Medical and social history Use of alcohol, tobacco or illicit drugs  Current medications and supplements including opioid prescriptions. Patient is not currently taking opioid prescriptions. Functional ability and status Nutritional status Physical activity Advanced directives List of other physicians Hospitalizations, surgeries, and ER visits in previous 12 months Vitals Screenings to include cognitive, depression, and falls Referrals and appointments  In addition, I have reviewed and discussed with patient certain preventive protocols, quality metrics, and best practice recommendations. A written personalized care plan for preventive services as well as general preventive health recommendations were provided to patient.   Lyle MARLA Right, [place]   07/31/2024   After Visit Summary: (MyChart) Due to this being a telephonic visit, the after visit summary with patients personalized plan was offered to patient via MyChart   Notes: Nothing significant to report at this time.

## 2024-08-01 NOTE — Telephone Encounter (Signed)
 Spoke with patient and she has decided to not pursue having the MRI. In the past she spoke with the radiologist and what was seen was a small cluster of moles. Therefore she has not concern in reference to breast cancer. I did advise her that if they call before the referral is canceled to decline the appointment.

## 2024-08-01 NOTE — Telephone Encounter (Signed)
 Noted. Thanks.  I will defer to patient and I canceled the order.

## 2024-08-01 NOTE — Addendum Note (Signed)
 Addended by: CLEATUS ARLYSS RAMAN on: 08/01/2024 04:26 PM   Modules accepted: Orders

## 2024-08-21 ENCOUNTER — Encounter: Payer: Self-pay | Admitting: Family Medicine

## 2024-08-21 ENCOUNTER — Other Ambulatory Visit: Payer: Self-pay | Admitting: Family Medicine

## 2024-08-21 DIAGNOSIS — M8580 Other specified disorders of bone density and structure, unspecified site: Secondary | ICD-10-CM

## 2024-08-21 DIAGNOSIS — Z1231 Encounter for screening mammogram for malignant neoplasm of breast: Secondary | ICD-10-CM

## 2024-08-21 DIAGNOSIS — M5430 Sciatica, unspecified side: Secondary | ICD-10-CM

## 2024-08-21 DIAGNOSIS — M549 Dorsalgia, unspecified: Secondary | ICD-10-CM

## 2024-08-22 ENCOUNTER — Other Ambulatory Visit: Payer: Self-pay

## 2024-08-22 DIAGNOSIS — M8580 Other specified disorders of bone density and structure, unspecified site: Secondary | ICD-10-CM

## 2024-09-07 ENCOUNTER — Ambulatory Visit (INDEPENDENT_AMBULATORY_CARE_PROVIDER_SITE_OTHER)
Admission: RE | Admit: 2024-09-07 | Discharge: 2024-09-07 | Disposition: A | Discharge status: Home / Self Care | Source: Ambulatory Visit | Attending: Family Medicine | Admitting: Family Medicine

## 2024-09-07 DIAGNOSIS — M8580 Other specified disorders of bone density and structure, unspecified site: Secondary | ICD-10-CM

## 2024-09-09 ENCOUNTER — Encounter: Payer: Self-pay | Admitting: Family Medicine

## 2024-09-09 ENCOUNTER — Ambulatory Visit: Payer: Self-pay | Admitting: Family Medicine

## 2024-09-09 ENCOUNTER — Other Ambulatory Visit: Payer: Self-pay | Admitting: Family Medicine

## 2024-09-09 DIAGNOSIS — R739 Hyperglycemia, unspecified: Secondary | ICD-10-CM

## 2024-09-09 DIAGNOSIS — M810 Age-related osteoporosis without current pathological fracture: Secondary | ICD-10-CM

## 2024-09-09 DIAGNOSIS — E785 Hyperlipidemia, unspecified: Secondary | ICD-10-CM

## 2024-09-10 ENCOUNTER — Other Ambulatory Visit: Payer: Self-pay | Admitting: Medical Genetics

## 2024-09-10 ENCOUNTER — Other Ambulatory Visit (INDEPENDENT_AMBULATORY_CARE_PROVIDER_SITE_OTHER)

## 2024-09-10 DIAGNOSIS — E785 Hyperlipidemia, unspecified: Secondary | ICD-10-CM | POA: Diagnosis not present

## 2024-09-10 DIAGNOSIS — R739 Hyperglycemia, unspecified: Secondary | ICD-10-CM | POA: Diagnosis not present

## 2024-09-10 DIAGNOSIS — M810 Age-related osteoporosis without current pathological fracture: Secondary | ICD-10-CM | POA: Diagnosis not present

## 2024-09-10 LAB — TSH: TSH: 2.09 u[IU]/mL (ref 0.35–5.50)

## 2024-09-10 LAB — LIPID PANEL
Cholesterol: 221 mg/dL — ABNORMAL HIGH (ref 0–200)
HDL: 61.7 mg/dL (ref >39.00)
LDL Cholesterol: 136 mg/dL — ABNORMAL HIGH (ref 0–99)
NonHDL: 159.77
Total CHOL/HDL Ratio: 4
Triglycerides: 121 mg/dL (ref 0.0–149.0)
VLDL: 24.2 mg/dL (ref 0.0–40.0)

## 2024-09-10 LAB — COMPREHENSIVE METABOLIC PANEL WITH GFR
ALT: 13 U/L (ref 0–35)
AST: 19 U/L (ref 0–37)
Albumin: 4.2 g/dL (ref 3.5–5.2)
Alkaline Phosphatase: 61 U/L (ref 39–117)
BUN: 13 mg/dL (ref 6–23)
CO2: 31 meq/L (ref 19–32)
Calcium: 9.4 mg/dL (ref 8.4–10.5)
Chloride: 102 meq/L (ref 96–112)
Creatinine, Ser: 0.63 mg/dL (ref 0.40–1.20)
GFR: 86.62 mL/min (ref >60.00)
Glucose, Bld: 109 mg/dL — ABNORMAL HIGH (ref 70–99)
Potassium: 4.3 meq/L (ref 3.5–5.1)
Sodium: 141 meq/L (ref 135–145)
Total Bilirubin: 0.4 mg/dL (ref 0.2–1.2)
Total Protein: 6.6 g/dL (ref 6.0–8.3)

## 2024-09-10 LAB — VITAMIN D 25 HYDROXY (VIT D DEFICIENCY, FRACTURES): VITD: 46.73 ng/mL (ref 30.00–100.00)

## 2024-09-11 LAB — HEMOGLOBIN A1C: Hgb A1c MFr Bld: 6 % (ref 4.6–6.5)

## 2024-09-12 ENCOUNTER — Ambulatory Visit: Payer: Self-pay | Admitting: Family Medicine

## 2024-09-17 ENCOUNTER — Encounter: Payer: Self-pay | Admitting: Family Medicine

## 2024-09-17 ENCOUNTER — Ambulatory Visit: Admitting: Family Medicine

## 2024-09-17 VITALS — BP 118/70 | HR 71 | Temp 98.3°F | Ht 65.0 in | Wt 178.0 lb

## 2024-09-17 DIAGNOSIS — R251 Tremor, unspecified: Secondary | ICD-10-CM | POA: Diagnosis not present

## 2024-09-17 DIAGNOSIS — Z0001 Encounter for general adult medical examination with abnormal findings: Secondary | ICD-10-CM | POA: Diagnosis not present

## 2024-09-17 DIAGNOSIS — Z0000 Encounter for general adult medical examination without abnormal findings: Secondary | ICD-10-CM

## 2024-09-17 DIAGNOSIS — L989 Disorder of the skin and subcutaneous tissue, unspecified: Secondary | ICD-10-CM | POA: Diagnosis not present

## 2024-09-17 DIAGNOSIS — M810 Age-related osteoporosis without current pathological fracture: Secondary | ICD-10-CM

## 2024-09-17 DIAGNOSIS — Z7189 Other specified counseling: Secondary | ICD-10-CM

## 2024-09-17 DIAGNOSIS — Z803 Family history of malignant neoplasm of breast: Secondary | ICD-10-CM

## 2024-09-17 DIAGNOSIS — D649 Anemia, unspecified: Secondary | ICD-10-CM

## 2024-09-17 DIAGNOSIS — M5430 Sciatica, unspecified side: Secondary | ICD-10-CM

## 2024-09-17 DIAGNOSIS — J310 Chronic rhinitis: Secondary | ICD-10-CM

## 2024-09-17 MED ORDER — IPRATROPIUM BROMIDE 0.06 % NA SOLN: 1.0000 | Freq: Two times a day (BID) | NASAL | 12 refills | Status: AC | PRN

## 2024-09-17 MED ORDER — MELOXICAM 15 MG PO TABS: 15.0000 mg | ORAL_TABLET | Freq: Every day | ORAL | 3 refills | Status: DC

## 2024-09-17 MED ORDER — PRIMIDONE 50 MG PO TABS: ORAL_TABLET | ORAL | 3 refills | Status: AC

## 2024-09-17 NOTE — Patient Instructions (Signed)
 Let me know if you can't get set up with dermatology.  Go to the lab on the way out.   If you have mychart we'll likely use that to update you.    Take care.  Glad to see you. Try nasal atrovent  in the meantime.

## 2024-09-17 NOTE — Progress Notes (Unsigned)
 Tremor. Taking primidone  50mg  day.  That helps.  No ADE on med.  Limiting caffeine.   CBC and iron  labs pending.  D/w pt.  Still taking iron .   She was not fasting on labs so glucose 109 was reasonable.    She is scheduled for fusion/laminectomy.  She saw Dr. Beuford today.  She is having pain standing>>sitting.    Osteoporosis, d/w pt about possibly restarting Fosamax  (started on fosamax  2012, off med 2017).  Would defer med start until done with surgery.    Patient wanted to proceed with genetic screening program.   D/w pt about derm eval re: skin lesions.  She wanted to get that addressed given the prev false positive on breast screening.   Chronic nasal gtt.  Sister has similar and improved with nasal atrovent .  Rx sent.    Colonoscopy: 2024 Mammogram: pending 2025 Bone Density: 2025 Influenza vaccine: Up to date Pneumococcal vaccine: Completed series Tdap vaccine: Up to date, 2024 Shingles vaccine: prev done.  COVID prev done.  RSV prev done.    Advance directive- daughter Clotilda designated if patient were incapacitated.    Meds, vitals, and allergies reviewed.   ROS: Per HPI unless specifically indicated in ROS section   SKs noted inferior to the R breast, chaperoned exam  Benign SK on the back.

## 2024-09-18 LAB — CBC WITH DIFFERENTIAL/PLATELET
Basophils Absolute: 0.1 K/uL (ref 0.0–0.1)
Basophils Relative: 0.9 % (ref 0.0–3.0)
Eosinophils Absolute: 0.1 K/uL (ref 0.0–0.7)
Eosinophils Relative: 2.2 % (ref 0.0–5.0)
HCT: 38.5 % (ref 36.0–46.0)
Hemoglobin: 13 g/dL (ref 12.0–15.0)
Lymphocytes Relative: 35.6 % (ref 12.0–46.0)
Lymphs Abs: 2.1 K/uL (ref 0.7–4.0)
MCHC: 33.8 g/dL (ref 30.0–36.0)
MCV: 93.6 fl (ref 78.0–100.0)
Monocytes Absolute: 0.5 K/uL (ref 0.1–1.0)
Monocytes Relative: 8.4 % (ref 3.0–12.0)
Neutro Abs: 3.1 K/uL (ref 1.4–7.7)
Neutrophils Relative %: 52.9 % (ref 43.0–77.0)
Platelets: 253 K/uL (ref 150.0–400.0)
RBC: 4.11 Mil/uL (ref 3.87–5.11)
RDW: 12.9 % (ref 11.5–15.5)
WBC: 5.8 K/uL (ref 4.0–10.5)

## 2024-09-18 LAB — FERRITIN: Ferritin: 91.8 ng/mL (ref 10.0–291.0)

## 2024-09-18 LAB — IRON: Iron: 63 ug/dL (ref 42–145)

## 2024-09-20 DIAGNOSIS — J310 Chronic rhinitis: Secondary | ICD-10-CM | POA: Insufficient documentation

## 2024-09-20 DIAGNOSIS — L989 Disorder of the skin and subcutaneous tissue, unspecified: Secondary | ICD-10-CM | POA: Insufficient documentation

## 2024-09-20 NOTE — Assessment & Plan Note (Signed)
 Osteoporosis, d/w pt about possibly restarting Fosamax  (started on fosamax  2012, off med 2017).  Would defer med start until done with surgery.

## 2024-09-20 NOTE — Assessment & Plan Note (Signed)
Advance directive-daughter Shannon designated if patient were incapacitated. 

## 2024-09-20 NOTE — Assessment & Plan Note (Signed)
 Colonoscopy: 2024 Mammogram: pending 2025 Bone Density: 2025 Influenza vaccine: Up to date Pneumococcal vaccine: Completed series Tdap vaccine: Up to date, 2024 Shingles vaccine: prev done.  COVID prev done.  RSV prev done.    Advance directive- daughter Clotilda designated if patient were incapacitated.

## 2024-09-20 NOTE — Assessment & Plan Note (Signed)
 Continue primidone  50mg  day.  That helps.  No ADE on med.  Limiting caffeine.  Update me as needed.

## 2024-09-20 NOTE — Assessment & Plan Note (Signed)
 D/w pt about derm eval re: skin lesions.  She wanted to get that addressed given the prev false positive on breast screening.  Refer to dermatology.

## 2024-09-20 NOTE — Assessment & Plan Note (Signed)
 History of. CBC and iron  labs pending.  D/w pt.  Still taking iron .  See notes on labs.

## 2024-09-20 NOTE — Assessment & Plan Note (Signed)
 She is scheduled for fusion/laminectomy.  She saw Dr. Beuford today.  She is having pain standing>>sitting.

## 2024-09-20 NOTE — Assessment & Plan Note (Signed)
 Sister has similar and improved with nasal atrovent .  Rx sent.  Update me as needed.

## 2024-09-23 ENCOUNTER — Ambulatory Visit: Payer: Self-pay | Admitting: Family Medicine

## 2024-09-29 LAB — GENECONNECT MOLECULAR SCREEN: Genetic Analysis Overall Interpretation: NEGATIVE

## 2024-10-03 ENCOUNTER — Inpatient Hospital Stay: Admission: RE | Admit: 2024-10-03 | Discharge: 2024-10-03 | Attending: Family Medicine | Admitting: Family Medicine

## 2024-10-03 ENCOUNTER — Other Ambulatory Visit: Payer: Self-pay | Admitting: Orthopedic Surgery

## 2024-10-03 DIAGNOSIS — Z1231 Encounter for screening mammogram for malignant neoplasm of breast: Secondary | ICD-10-CM

## 2024-10-08 ENCOUNTER — Other Ambulatory Visit

## 2024-10-10 ENCOUNTER — Ambulatory Visit: Payer: Self-pay | Admitting: Family Medicine

## 2024-10-22 NOTE — Pre-Procedure Instructions (Signed)
 Surgical Instructions   Your procedure is scheduled on November 01, 2024. Report to [facility] Main Entrance A at 5:30 A.M., then check in with the Admitting office. Any questions or running late day of surgery: call [phone]  Questions prior to your surgery date: call [phone], Monday-Friday, 8am-4pm. If you experience any cold or flu symptoms such as cough, fever, chills, shortness of breath, etc. between now and your scheduled surgery, please notify us  at the above number.     Remember:  Do not eat after midnight the night before your surgery  You may drink clear liquids until 4:30 AM the morning of your surgery.   Clear liquids allowed are: Water, Non-Citrus Juices (without pulp), Carbonated Beverages, Clear Tea (no milk, honey, etc.), Black Coffee Only (NO MILK, CREAM OR POWDERED CREAMER of any kind), and Gatorade.  Patient Instructions  The night before surgery:  No food after midnight. ONLY clear liquids after midnight  The day of surgery (if you do NOT have diabetes):  Drink ONE (1) Pre-Surgery Clear Ensure by 4:30 AM the morning of surgery. Drink in one sitting. Do not sip.  This drink was given to you during your hospital  pre-op appointment visit.  Nothing else to drink after completing the  Pre-Surgery Clear Ensure.         If you have questions, please contact your surgeons office.    Take these medicines the morning of surgery with A SIP OF WATER: primidone  (MYSOLINE )    May take these medicines IF NEEDED: ipratropium (ATROVENT ) nasal spray    One week prior to surgery, STOP taking any Aspirin (unless otherwise instructed by your surgeon) Aleve, Naproxen, Ibuprofen , Motrin , Advil , Goody's, BC's, all herbal medications, Omega-3 Fatty Acids (FISH OIL PO), and non-prescription vitamins. This includes your medication: meloxicam  (MOBIC )                      Do NOT Smoke (Tobacco/Vaping) for 24 hours prior to your procedure.  If you use a CPAP at night,  you may bring your mask/headgear for your overnight stay.   You will be asked to remove any contacts, glasses, piercing's, hearing aid's, dentures/partials prior to surgery. Please bring cases for these items if needed.    Patients discharged the day of surgery will not be allowed to drive home, and someone needs to stay with them for 24 hours.  SURGICAL WAITING ROOM VISITATION Patients may have no more than 2 support people in the waiting area - these visitors may rotate.   Pre-op nurse will coordinate an appropriate time for 1 ADULT support person, who may not rotate, to accompany patient in pre-op.  Children under the age of 8 must have an adult with them who is not the patient and must remain in the main waiting area with an adult.  If the patient needs to stay at the hospital during part of their recovery, the visitor guidelines for inpatient rooms apply.  Please refer to the [facility] website for the visitor guidelines for any additional information.   If you received a COVID test during your pre-op visit  it is requested that you wear a mask when out in public, stay away from anyone that may not be feeling well and notify your surgeon if you develop symptoms. If you have been in contact with anyone that has tested positive in the last 10 days please notify you surgeon.      Pre-operative 4 CHG Bathing Instructions  You can play a key role in reducing the risk of infection after surgery. Your skin needs to be as free of germs as possible. You can reduce the number of germs on your skin by washing with CHG (chlorhexidine gluconate) soap before surgery. CHG is an antiseptic soap that kills germs and continues to kill germs even after washing.   DO NOT use if you have an allergy to chlorhexidine/CHG or antibacterial soaps. If your skin becomes reddened or irritated, stop using the CHG and notify one of our RNs at [phone].   Please shower with the CHG soap starting 4 days before  surgery using the following schedule:     Please keep in mind the following:  DO NOT shave, including legs and underarms, starting the day of your first shower.   You may shave your face at any point before/day of surgery.  Place clean sheets on your bed the day you start using CHG soap. Use a clean washcloth (not used since being washed) for each shower. DO NOT sleep with pets once you start using the CHG.   CHG Shower Instructions:  Wash your face and private area with normal soap. If you choose to wash your hair, wash first with your normal shampoo.  After you use shampoo/soap, rinse your hair and body thoroughly to remove shampoo/soap residue.  Turn the water OFF and apply  bottle of CHG soap to a CLEAN washcloth.  Apply CHG soap ONLY FROM YOUR NECK DOWN TO YOUR TOES (washing for 3-5 minutes)  DO NOT use CHG soap on face, private areas, open wounds, or sores.  Pay special attention to the area where your surgery is being performed.  If you are having back surgery, having someone wash your back for you may be helpful. Wait 2 minutes after CHG soap is applied, then you may rinse off the CHG soap.  Pat dry with a clean towel  Put on clean clothes/pajamas   If you choose to wear lotion, please use ONLY the CHG-compatible lotions that are listed below.  Additional instructions for the day of surgery:  If you choose, you may shower the morning of surgery with an antibacterial soap.  DO NOT APPLY any lotions, deodorants, cologne, or perfumes.   Do not bring valuables to the hospital. [facility] is not responsible for any belongings/valuables. Do not wear nail polish, gel polish, artificial nails, or any other type of covering on natural nails (fingers and toes) Do not wear jewelry or makeup Put on clean/comfortable clothes.  Please brush your teeth.  Ask your nurse before applying any prescription medications to the skin.     CHG Compatible Lotions   Aveeno Moisturizing  lotion  Cetaphil Moisturizing Cream  Cetaphil Moisturizing Lotion  Clairol Herbal Essence Moisturizing Lotion, Dry Skin  Clairol Herbal Essence Moisturizing Lotion, Extra Dry Skin  Clairol Herbal Essence Moisturizing Lotion, Normal Skin  Curel Age Defying Therapeutic Moisturizing Lotion with Alpha Hydroxy  Curel Extreme Care Body Lotion  Curel Soothing Hands Moisturizing Hand Lotion  Curel Therapeutic Moisturizing Cream, Fragrance-Free  Curel Therapeutic Moisturizing Lotion, Fragrance-Free  Curel Therapeutic Moisturizing Lotion, Original Formula  Eucerin Daily Replenishing Lotion  Eucerin Dry Skin Therapy Plus Alpha Hydroxy Crme  Eucerin Dry Skin Therapy Plus Alpha Hydroxy Lotion  Eucerin Original Crme  Eucerin Original Lotion  Eucerin Plus Crme Eucerin Plus Lotion  Eucerin TriLipid Replenishing Lotion  Keri Anti-Bacterial Hand Lotion  Keri Deep Conditioning Original Lotion Dry Skin Formula Softly Scented  Keri Deep  Conditioning Original Lotion, Fragrance Free Sensitive Skin Formula  Keri Lotion Fast Absorbing Fragrance Free Sensitive Skin Formula  Keri Lotion Fast Absorbing Softly Scented Dry Skin Formula  Keri Original Lotion  Keri Skin Renewal Lotion Keri Silky Smooth Lotion  Keri Silky Smooth Sensitive Skin Lotion  Nivea Body Creamy Conditioning Oil  Nivea Body Extra Enriched Lotion  Nivea Body Original Lotion  Nivea Body Sheer Moisturizing Lotion Nivea Crme  Nivea Skin Firming Lotion  NutraDerm 30 Skin Lotion  NutraDerm Skin Lotion  NutraDerm Therapeutic Skin Cream  NutraDerm Therapeutic Skin Lotion  ProShield Protective Hand Cream  Provon moisturizing lotion  Please read over the following fact sheets that you were given.

## 2024-10-23 ENCOUNTER — Encounter (HOSPITAL_COMMUNITY): Payer: Self-pay

## 2024-10-23 ENCOUNTER — Encounter (HOSPITAL_COMMUNITY)
Admission: RE | Admit: 2024-10-23 | Discharge: 2024-10-23 | Disposition: A | Discharge status: Home / Self Care | Source: Ambulatory Visit | Attending: Orthopedic Surgery | Admitting: Orthopedic Surgery

## 2024-10-23 ENCOUNTER — Other Ambulatory Visit: Payer: Self-pay

## 2024-10-23 VITALS — BP 155/63 | HR 63 | Temp 97.6°F | Resp 18 | Ht 65.0 in | Wt 182.3 lb

## 2024-10-23 DIAGNOSIS — Z01818 Encounter for other preprocedural examination: Secondary | ICD-10-CM

## 2024-10-23 DIAGNOSIS — Z01812 Encounter for preprocedural laboratory examination: Secondary | ICD-10-CM | POA: Insufficient documentation

## 2024-10-23 LAB — CBC
HCT: 40.3 % (ref 36.0–46.0)
Hemoglobin: 13.4 g/dL (ref 12.0–15.0)
MCH: 31.5 pg (ref 26.0–34.0)
MCHC: 33.3 g/dL (ref 30.0–36.0)
MCV: 94.8 fL (ref 80.0–100.0)
Platelets: 264 K/uL (ref 150–400)
RBC: 4.25 MIL/uL (ref 3.87–5.11)
RDW: 12.3 % (ref 11.5–15.5)
WBC: 6.8 K/uL (ref 4.0–10.5)
nRBC: 0 % (ref 0.0–0.2)

## 2024-10-23 LAB — BASIC METABOLIC PANEL WITH GFR
Anion gap: 10 (ref 5–15)
BUN: 20 mg/dL (ref 8–23)
CO2: 29 mmol/L (ref 22–32)
Calcium: 9.5 mg/dL (ref 8.9–10.3)
Chloride: 100 mmol/L (ref 98–111)
Creatinine, Ser: 0.81 mg/dL (ref 0.44–1.00)
GFR, Estimated: >60 mL/min
Glucose, Bld: 101 mg/dL — ABNORMAL HIGH (ref 70–99)
Potassium: 4.1 mmol/L (ref 3.5–5.1)
Sodium: 139 mmol/L (ref 135–145)

## 2024-10-23 LAB — SURGICAL PCR SCREEN
MRSA, PCR: NEGATIVE
Staphylococcus aureus: NEGATIVE

## 2024-10-23 LAB — TYPE AND SCREEN
ABO/RH(D): A POS
Antibody Screen: NEGATIVE

## 2024-10-23 NOTE — Progress Notes (Signed)
 PCP - Dr. Arlyss Solian Cardiologist - Denies  PPM/ICD - Denies Device Orders - n/a Rep Notified - n/a  Chest x-ray - n/a EKG - Denies Stress Test - Denies ECHO - Denies Cardiac Cath - Denies  Sleep Study - Denies CPAP - n/a  Pt has history of DM, but has not been on medication since 2015. All A1c results since then are in pre-DM range. Pt does not check her blood sugar at home. Per Lynwood Hope, PA-C, okay to treat pt as Pre-DM for upcoming surgery  Last dose of GLP1 agonist-  n/a GLP1 instructions: n/a  Blood Thinner Instructions: n/a Aspirin Instructions: n/a  ERAS Protcol - Clear liquids until 0430 morning of surgery PRE-SURGERY Ensure or G2- Ensure given to pt with instructions  COVID TEST- n/a   Anesthesia review: No  Patient denies shortness of breath, fever, cough and chest pain at PAT appointment. Pt denies any respiratory illness/infection in the last two months.    All instructions explained to the patient, with a verbal understanding of the material. Patient agrees to go over the instructions while at home for a better understanding. Patient also instructed to self quarantine after being tested for COVID-19. The opportunity to ask questions was provided.

## 2024-10-29 MED ORDER — EPHEDRINE 5 MG/ML INJ
INTRAVENOUS | Status: AC
  Filled 2024-10-29: qty 5

## 2024-10-29 MED ORDER — ONDANSETRON HCL 4 MG/2ML IJ SOLN
INTRAMUSCULAR | Status: AC
  Filled 2024-10-29: qty 2

## 2024-10-29 MED ORDER — ROCURONIUM BROMIDE 10 MG/ML (PF) SYRINGE
PREFILLED_SYRINGE | INTRAVENOUS | Status: AC
  Filled 2024-10-29: qty 10

## 2024-10-29 MED ORDER — PHENYLEPHRINE 80 MCG/ML (10ML) SYRINGE FOR IV PUSH (FOR BLOOD PRESSURE SUPPORT)
PREFILLED_SYRINGE | INTRAVENOUS | Status: AC
  Filled 2024-10-29: qty 10

## 2024-10-29 MED ORDER — PROPOFOL 10 MG/ML IV BOLUS
INTRAVENOUS | Status: AC
  Filled 2024-10-29: qty 20

## 2024-10-29 MED ORDER — LIDOCAINE 2% (20 MG/ML) 5 ML SYRINGE
INTRAMUSCULAR | Status: AC
  Filled 2024-10-29: qty 5

## 2024-10-29 MED ORDER — FENTANYL CITRATE (PF) 250 MCG/5ML IJ SOLN
INTRAMUSCULAR | Status: AC
  Filled 2024-10-29: qty 5

## 2024-11-01 ENCOUNTER — Ambulatory Visit (HOSPITAL_COMMUNITY)

## 2024-11-01 ENCOUNTER — Ambulatory Visit (HOSPITAL_COMMUNITY): Payer: Self-pay | Admitting: Anesthesiology

## 2024-11-01 ENCOUNTER — Encounter (HOSPITAL_COMMUNITY)
Admission: RE | Disposition: A | Discharge status: Home / Self Care | Payer: Self-pay | Source: Home / Self Care | Attending: Orthopedic Surgery

## 2024-11-01 ENCOUNTER — Other Ambulatory Visit: Payer: Self-pay

## 2024-11-01 ENCOUNTER — Observation Stay (HOSPITAL_COMMUNITY)
Admission: RE | Admit: 2024-11-01 | Discharge: 2024-11-02 | Disposition: A | Discharge status: Home / Self Care | Attending: Orthopedic Surgery | Admitting: Orthopedic Surgery

## 2024-11-01 ENCOUNTER — Encounter (HOSPITAL_COMMUNITY): Payer: Self-pay | Admitting: Orthopedic Surgery

## 2024-11-01 DIAGNOSIS — M48061 Spinal stenosis, lumbar region without neurogenic claudication: Principal | ICD-10-CM | POA: Insufficient documentation

## 2024-11-01 DIAGNOSIS — M810 Age-related osteoporosis without current pathological fracture: Secondary | ICD-10-CM | POA: Insufficient documentation

## 2024-11-01 DIAGNOSIS — M5416 Radiculopathy, lumbar region: Secondary | ICD-10-CM | POA: Diagnosis not present

## 2024-11-01 DIAGNOSIS — Z87891 Personal history of nicotine dependence: Secondary | ICD-10-CM | POA: Diagnosis not present

## 2024-11-01 DIAGNOSIS — M4726 Other spondylosis with radiculopathy, lumbar region: Secondary | ICD-10-CM | POA: Diagnosis not present

## 2024-11-01 DIAGNOSIS — M5430 Sciatica, unspecified side: Secondary | ICD-10-CM | POA: Diagnosis present

## 2024-11-01 DIAGNOSIS — E785 Hyperlipidemia, unspecified: Secondary | ICD-10-CM | POA: Diagnosis not present

## 2024-11-01 DIAGNOSIS — E119 Type 2 diabetes mellitus without complications: Secondary | ICD-10-CM

## 2024-11-01 LAB — GLUCOSE, CAPILLARY: Glucose-Capillary: 149 mg/dL — ABNORMAL HIGH (ref 70–99)

## 2024-11-01 LAB — ABO/RH: ABO/RH(D): A POS

## 2024-11-01 MED ORDER — BISACODYL 5 MG PO TBEC: 5.0000 mg | DELAYED_RELEASE_TABLET | Freq: Every day | ORAL | Status: DC | PRN

## 2024-11-01 MED ORDER — ALUM & MAG HYDROXIDE-SIMETH 200-200-20 MG/5ML PO SUSP: 30.0000 mL | Freq: Four times a day (QID) | ORAL | Status: DC | PRN

## 2024-11-01 MED ORDER — DOCUSATE SODIUM 100 MG PO CAPS
100.0000 mg | ORAL_CAPSULE | Freq: Two times a day (BID) | ORAL | Status: DC
  Administered 2024-11-01 – 2024-11-02 (×2): 100 mg via ORAL
  Filled 2024-11-01 (×2): qty 1

## 2024-11-01 MED ORDER — ACETAMINOPHEN 650 MG RE SUPP: 650.0000 mg | RECTAL | Status: DC | PRN

## 2024-11-01 MED ORDER — BUPIVACAINE LIPOSOME 1.3 % IJ SUSP
INTRAMUSCULAR | Status: DC | PRN
  Administered 2024-11-01: 20 mL

## 2024-11-01 MED ORDER — BUPIVACAINE-EPINEPHRINE 0.25% -1:200000 IJ SOLN
INTRAMUSCULAR | Status: DC | PRN
  Administered 2024-11-01: 10 mL
  Administered 2024-11-01: 20 mL

## 2024-11-01 MED ORDER — ACETAMINOPHEN 10 MG/ML IV SOLN
1000.0000 mg | Freq: Once | INTRAVENOUS | Status: DC | PRN
  Administered 2024-11-01: 1000 mg via INTRAVENOUS

## 2024-11-01 MED ORDER — DEXAMETHASONE SOD PHOSPHATE PF 10 MG/ML IJ SOLN
INTRAMUSCULAR | Status: DC | PRN
  Administered 2024-11-01: 10 mg via INTRAVENOUS

## 2024-11-01 MED ORDER — 0.9 % SODIUM CHLORIDE (POUR BTL) OPTIME
TOPICAL | Status: DC | PRN
  Administered 2024-11-01: 2000 mL

## 2024-11-01 MED ORDER — THROMBIN 20000 UNITS EX SOLR
CUTANEOUS | Status: DC | PRN
  Administered 2024-11-01: 20 mL

## 2024-11-01 MED ORDER — SENNOSIDES-DOCUSATE SODIUM 8.6-50 MG PO TABS: 1.0000 | ORAL_TABLET | Freq: Every evening | ORAL | Status: DC | PRN

## 2024-11-01 MED ORDER — FERROUS SULFATE 325 (65 FE) MG PO TABS
325.0000 mg | ORAL_TABLET | Freq: Every day | ORAL | Status: DC
  Administered 2024-11-02: 325 mg via ORAL
  Filled 2024-11-01: qty 1

## 2024-11-01 MED ORDER — SODIUM CHLORIDE 0.9% FLUSH
3.0000 mL | Freq: Two times a day (BID) | INTRAVENOUS | Status: DC
  Administered 2024-11-01 – 2024-11-02 (×2): 3 mL via INTRAVENOUS

## 2024-11-01 MED ORDER — MORPHINE SULFATE (PF) 2 MG/ML IV SOLN: 1.0000 mg | INTRAVENOUS | Status: DC | PRN

## 2024-11-01 MED ORDER — THROMBIN 20000 UNITS EX SOLR
CUTANEOUS | Status: DC | PRN
  Administered 2024-11-01: 20000 [IU] via TOPICAL

## 2024-11-01 MED ORDER — OXYCODONE HCL 5 MG/5ML PO SOLN: 5.0000 mg | Freq: Once | ORAL | Status: DC | PRN

## 2024-11-01 MED ORDER — MIDAZOLAM HCL (PF) 2 MG/2ML IJ SOLN
INTRAMUSCULAR | Status: DC | PRN
  Administered 2024-11-01: 2 mg via INTRAVENOUS

## 2024-11-01 MED ORDER — METHOCARBAMOL 1000 MG/10ML IJ SOLN: 500.0000 mg | Freq: Four times a day (QID) | INTRAMUSCULAR | Status: DC | PRN

## 2024-11-01 MED ORDER — EPHEDRINE SULFATE-NACL 50-0.9 MG/10ML-% IV SOSY
PREFILLED_SYRINGE | INTRAVENOUS | Status: DC | PRN
  Administered 2024-11-01: 5 mg via INTRAVENOUS

## 2024-11-01 MED ORDER — PROPOFOL 10 MG/ML IV BOLUS
INTRAVENOUS | Status: AC
  Filled 2024-11-01: qty 20

## 2024-11-01 MED ORDER — ORAL CARE MOUTH RINSE: 15.0000 mL | Freq: Once | OROMUCOSAL | Status: AC

## 2024-11-01 MED ORDER — ZOLPIDEM TARTRATE 5 MG PO TABS: 5.0000 mg | ORAL_TABLET | Freq: Every evening | ORAL | Status: DC | PRN

## 2024-11-01 MED ORDER — CEFAZOLIN SODIUM-DEXTROSE 2-4 GM/100ML-% IV SOLN
2.0000 g | INTRAVENOUS | Status: AC
  Administered 2024-11-01: 2 g via INTRAVENOUS
  Filled 2024-11-01: qty 100

## 2024-11-01 MED ORDER — SODIUM CHLORIDE 0.9 % IV SOLN: 250.0000 mL | INTRAVENOUS | Status: DC

## 2024-11-01 MED ORDER — FLEET ENEMA RE ENEM: 1.0000 | ENEMA | Freq: Once | RECTAL | Status: DC | PRN

## 2024-11-01 MED ORDER — METHOCARBAMOL 500 MG PO TABS
500.0000 mg | ORAL_TABLET | Freq: Four times a day (QID) | ORAL | Status: DC | PRN
  Administered 2024-11-01 – 2024-11-02 (×3): 500 mg via ORAL
  Filled 2024-11-01 (×3): qty 1

## 2024-11-01 MED ORDER — FENTANYL CITRATE (PF) 100 MCG/2ML IJ SOLN
25.0000 ug | INTRAMUSCULAR | Status: DC | PRN
  Administered 2024-11-01: 50 ug via INTRAVENOUS
  Administered 2024-11-01: 25 ug via INTRAVENOUS

## 2024-11-01 MED ORDER — THROMBIN 20000 UNITS EX SOLR
CUTANEOUS | Status: AC
  Filled 2024-11-01: qty 20000

## 2024-11-01 MED ORDER — PROPOFOL 10 MG/ML IV BOLUS
INTRAVENOUS | Status: DC | PRN
  Administered 2024-11-01: 200 mg via INTRAVENOUS

## 2024-11-01 MED ORDER — MENTHOL 3 MG MT LOZG: 1.0000 | LOZENGE | OROMUCOSAL | Status: DC | PRN

## 2024-11-01 MED ORDER — FENTANYL CITRATE (PF) 100 MCG/2ML IJ SOLN
INTRAMUSCULAR | Status: AC
  Filled 2024-11-01: qty 2

## 2024-11-01 MED ORDER — ACETAMINOPHEN 10 MG/ML IV SOLN
INTRAVENOUS | Status: AC
  Filled 2024-11-01: qty 100

## 2024-11-01 MED ORDER — PRIMIDONE 50 MG PO TABS
50.0000 mg | ORAL_TABLET | Freq: Every day | ORAL | Status: DC
  Administered 2024-11-02: 50 mg via ORAL
  Filled 2024-11-01 (×2): qty 1

## 2024-11-01 MED ORDER — DROPERIDOL 2.5 MG/ML IJ SOLN: 0.6250 mg | Freq: Once | INTRAMUSCULAR | Status: DC | PRN

## 2024-11-01 MED ORDER — HYDROCODONE-ACETAMINOPHEN 5-325 MG PO TABS
1.0000 | ORAL_TABLET | ORAL | Status: DC | PRN
  Administered 2024-11-01 – 2024-11-02 (×6): 2 via ORAL
  Filled 2024-11-01 (×6): qty 2

## 2024-11-01 MED ORDER — PHENOL 1.4 % MT LIQD: 1.0000 | OROMUCOSAL | Status: DC | PRN

## 2024-11-01 MED ORDER — FENTANYL CITRATE (PF) 250 MCG/5ML IJ SOLN
INTRAMUSCULAR | Status: DC | PRN
  Administered 2024-11-01: 150 ug via INTRAVENOUS
  Administered 2024-11-01 (×2): 50 ug via INTRAVENOUS

## 2024-11-01 MED ORDER — LACTATED RINGERS IV SOLN: INTRAVENOUS | Status: DC

## 2024-11-01 MED ORDER — OXYCODONE-ACETAMINOPHEN 5-325 MG PO TABS: 1.0000 | ORAL_TABLET | ORAL | Status: DC | PRN

## 2024-11-01 MED ORDER — BUPIVACAINE LIPOSOME 1.3 % IJ SUSP
INTRAMUSCULAR | Status: AC
  Filled 2024-11-01: qty 20

## 2024-11-01 MED ORDER — ONDANSETRON HCL 4 MG/2ML IJ SOLN
INTRAMUSCULAR | Status: DC | PRN
  Administered 2024-11-01: 4 mg via INTRAVENOUS

## 2024-11-01 MED ORDER — PHENYLEPHRINE HCL-NACL 20-0.9 MG/250ML-% IV SOLN
INTRAVENOUS | Status: DC | PRN
  Administered 2024-11-01: 15 ug/min via INTRAVENOUS

## 2024-11-01 MED ORDER — SODIUM CHLORIDE 0.9% FLUSH: 3.0000 mL | INTRAVENOUS | Status: DC | PRN

## 2024-11-01 MED ORDER — ADULT MULTIVITAMIN W/MINERALS CH
1.0000 | ORAL_TABLET | Freq: Every day | ORAL | Status: DC
  Administered 2024-11-02: 1 via ORAL
  Filled 2024-11-01: qty 1

## 2024-11-01 MED ORDER — CHLORHEXIDINE GLUCONATE 0.12 % MT SOLN
15.0000 mL | Freq: Once | OROMUCOSAL | Status: AC
  Administered 2024-11-01: 15 mL via OROMUCOSAL
  Filled 2024-11-01: qty 15

## 2024-11-01 MED ORDER — OXYCODONE HCL 5 MG PO TABS: 5.0000 mg | ORAL_TABLET | Freq: Once | ORAL | Status: DC | PRN

## 2024-11-01 MED ORDER — ONDANSETRON HCL 4 MG/2ML IJ SOLN: 4.0000 mg | Freq: Four times a day (QID) | INTRAMUSCULAR | Status: DC | PRN

## 2024-11-01 MED ORDER — MIDAZOLAM HCL 2 MG/2ML IJ SOLN
INTRAMUSCULAR | Status: AC
  Filled 2024-11-01: qty 2

## 2024-11-01 MED ORDER — CREATINE 5000 MG PO PACK: 5.0000 g | PACK | Freq: Every day | ORAL | Status: DC

## 2024-11-01 MED ORDER — FENTANYL CITRATE (PF) 250 MCG/5ML IJ SOLN
INTRAMUSCULAR | Status: AC
  Filled 2024-11-01: qty 5

## 2024-11-01 MED ORDER — CEFAZOLIN SODIUM-DEXTROSE 2-4 GM/100ML-% IV SOLN
2.0000 g | Freq: Three times a day (TID) | INTRAVENOUS | Status: AC
  Administered 2024-11-01 (×2): 2 g via INTRAVENOUS
  Filled 2024-11-01 (×2): qty 100

## 2024-11-01 MED ORDER — LIDOCAINE 2% (20 MG/ML) 5 ML SYRINGE
INTRAMUSCULAR | Status: DC | PRN
  Administered 2024-11-01: 30 mg via INTRAVENOUS

## 2024-11-01 MED ORDER — ONDANSETRON HCL 4 MG PO TABS: 4.0000 mg | ORAL_TABLET | Freq: Four times a day (QID) | ORAL | Status: DC | PRN

## 2024-11-01 MED ORDER — ACETAMINOPHEN 325 MG PO TABS: 650.0000 mg | ORAL_TABLET | ORAL | Status: DC | PRN

## 2024-11-01 MED ORDER — BUPIVACAINE-EPINEPHRINE (PF) 0.25% -1:200000 IJ SOLN
INTRAMUSCULAR | Status: AC
  Filled 2024-11-01: qty 30

## 2024-11-01 MED ORDER — ROCURONIUM BROMIDE 10 MG/ML (PF) SYRINGE
PREFILLED_SYRINGE | INTRAVENOUS | Status: DC | PRN
  Administered 2024-11-01: 60 mg via INTRAVENOUS

## 2024-11-01 MED ORDER — POTASSIUM CHLORIDE IN NACL 20-0.9 MEQ/L-% IV SOLN
INTRAVENOUS | Status: DC
  Filled 2024-11-01: qty 1000

## 2024-11-01 MED ORDER — SUGAMMADEX SODIUM 200 MG/2ML IV SOLN
INTRAVENOUS | Status: DC | PRN
  Administered 2024-11-01: 200 mg via INTRAVENOUS

## 2024-11-01 MED ORDER — VITAMIN D3 50 MCG (2000 UT) PO CAPS: 2000.0000 [IU] | ORAL_CAPSULE | Freq: Every day | ORAL | Status: DC

## 2024-11-01 MED ORDER — POVIDONE-IODINE 10 % EX SWAB
2.0000 | Freq: Once | CUTANEOUS | Status: AC
  Administered 2024-11-01: 2 via TOPICAL

## 2024-11-01 MED ORDER — GLYCOPYRROLATE 0.2 MG/ML IJ SOLN
INTRAMUSCULAR | Status: DC | PRN
  Administered 2024-11-01: .2 mg via INTRAVENOUS

## 2024-11-01 MED ORDER — IPRATROPIUM BROMIDE 0.06 % NA SOLN: 1.0000 | Freq: Two times a day (BID) | NASAL | Status: DC | PRN

## 2024-11-01 NOTE — Transfer of Care (Signed)
 Immediate Anesthesia Transfer of Care Note  Patient: Theresa Eaton  Procedure(s) Performed: POSTERIOR LUMBAR FUSION 1 LEVEL  Patient Location: PACU  Anesthesia Type:General  Level of Consciousness: awake and alert   Airway & Oxygen Therapy: Patient Spontanous Breathing and Patient connected to nasal cannula oxygen  Post-op Assessment: Report given to RN and Post -op Vital signs reviewed and stable  Post vital signs: Reviewed and stable  Last Vitals:  Vitals Value Taken Time  BP 131/65 11/01/24 10:39  Temp    Pulse 88 11/01/24 10:41  Resp 25 11/01/24 10:41  SpO2 100 % 11/01/24 10:41  Vitals shown include unfiled device data.  Last Pain:  Vitals:   11/01/24 0639  TempSrc:   PainSc: 3       Patients Stated Pain Goal: 1 (11/01/24 9360)  Complications: No notable events documented.

## 2024-11-01 NOTE — Anesthesia Procedure Notes (Signed)
 Procedure Name: Intubation Date/Time: 11/01/2024 7:42 AM  Performed by: Evette Ade, CRNAPre-anesthesia Checklist: Emergency Drugs available, Patient identified, Suction available, Patient being monitored and Timeout performed Patient Re-evaluated:Patient Re-evaluated prior to induction Oxygen Delivery Method: Circle system utilized Preoxygenation: Pre-oxygenation with 100% oxygen Induction Type: IV induction Ventilation: Mask ventilation without difficulty and Oral airway inserted - appropriate to patient size Laryngoscope Size: Mac and 4 Grade View: Grade I Tube type: Oral Tube size: 7.0 mm Number of attempts: 1 Airway Equipment and Method: Stylet Placement Confirmation: ETT inserted through vocal cords under direct vision, positive ETCO2 and breath sounds checked- equal and bilateral Secured at: 22 cm Tube secured with: Tape Dental Injury: Teeth and Oropharynx as per pre-operative assessment

## 2024-11-01 NOTE — Anesthesia Preprocedure Evaluation (Addendum)
"                                    Anesthesia Evaluation  Patient identified by MRN, date of birth, ID band Patient awake    Reviewed: Allergy & Precautions, H&P , NPO status , Patient's Chart, lab work & pertinent test results  History of Anesthesia Complications Negative for: history of anesthetic complications  Airway Mallampati: II  TM Distance: >3 FB Neck ROM: Full    Dental no notable dental hx.    Pulmonary former smoker   Pulmonary exam normal breath sounds clear to auscultation       Cardiovascular (-) angina negative cardio ROS Normal cardiovascular exam Rhythm:Regular Rate:Normal     Neuro/Psych neg Seizures negative neurological ROS  negative psych ROS   GI/Hepatic Neg liver ROS,GERD  ,,  Endo/Other  diabetes, Type 2    Renal/GU negative Renal ROS  negative genitourinary   Musculoskeletal  (+) Arthritis ,    Abdominal   Peds negative pediatric ROS (+)  Hematology negative hematology ROS (+)   Anesthesia Other Findings   Reproductive/Obstetrics negative OB ROS                              Anesthesia Physical Anesthesia Plan  ASA: 3  Anesthesia Plan: General   Post-op Pain Management:    Induction: Intravenous  PONV Risk Score and Plan: 3 and Ondansetron , Dexamethasone  and Treatment may vary due to age or medical condition  Airway Management Planned: Oral ETT  Additional Equipment: None  Intra-op Plan:   Post-operative Plan: Extubation in OR  Informed Consent: I have reviewed the patients History and Physical, chart, labs and discussed the procedure including the risks, benefits and alternatives for the proposed anesthesia with the patient or authorized representative who has indicated his/her understanding and acceptance.     Dental advisory given  Plan Discussed with: CRNA  Anesthesia Plan Comments:          Anesthesia Quick Evaluation  "

## 2024-11-01 NOTE — Anesthesia Postprocedure Evaluation (Signed)
"   Anesthesia Post Note  Patient: Theresa Eaton  Procedure(s) Performed: POSTERIOR LUMBAR FUSION 1 LEVEL     Patient location during evaluation: PACU Anesthesia Type: General Level of consciousness: awake and alert Pain management: pain level controlled Vital Signs Assessment: post-procedure vital signs reviewed and stable Respiratory status: spontaneous breathing, nonlabored ventilation, respiratory function stable and patient connected to nasal cannula oxygen Cardiovascular status: blood pressure returned to baseline and stable Postop Assessment: no apparent nausea or vomiting Anesthetic complications: no   No notable events documented.  Last Vitals:  Vitals:   11/01/24 1039 11/01/24 1045  BP: 131/65 135/72  Pulse: 86 85  Resp: 20 (!) 23  Temp: 36.6 C   SpO2: 100% 100%    Last Pain:  Vitals:   11/01/24 1039  TempSrc:   PainSc: Asleep                 Thom JONELLE Peoples      "

## 2024-11-01 NOTE — Op Note (Signed)
 PATIENT NAME: Theresa Eaton   MEDICAL RECORD NO.:   [id number]   DATE OF BIRTH: [birth year]   DATE OF PROCEDURE: 11/01/2024                               OPERATIVE REPORT     PREOPERATIVE DIAGNOSES: 1. Bilateral lumbar radiculopathy 2. L4-5 spinal stenosis 3. L4-5 spondylolisthesis   POSTOPERATIVE DIAGNOSES: 1. Bilateral lumbar radiculopathy 2. L4-5 spinal stenosis 3. L4-5 spondylolisthesis   PROCEDURES: 1.  Bilateral L4/5 decompression, including bilateral partial facetectomy foraminotomy 2.  Bilateral L4-5 posterolateral fusion 3.  Placement of segmental posterior instrumentation L4, L5 bilaterally  4.  Use of local autograft 5.  Use of morselized allograft - Vivigen anmd Pliafix 6.  Intraoperative use of fluoroscopy   SURGEON:  Oneil Priestly, MD   ASSISTANT:  Ileana Clara, PA-C   ANESTHESIA:  General endotracheal anesthesia.   COMPLICATIONS:  None   DISPOSITION:  Stable   ESTIMATED BLOOD LOSS:  100cc   INDICATIONS FOR SURGERY:  Briefly,  Ms. Barno is a pleasant 74 -year-old female who did present to me with severe and ongoing pain in the right and left legs. I did feel that the symptoms were secondary to the findings noted above.  Of note, she is status post a compression fracture of the L5 level. The patient failed conservative care and did wish to proceed with the procedure noted above.   OPERATIVE DETAILS:  On 11/01/2024, the patient was brought to surgery and general endotracheal anesthesia was administered.  The patient was placed prone on a well-padded flat Jackson bed with a spinal frame.  Antibiotics were given and a time-out procedure was performed. The back was prepped and draped in the usual fashion.  A midline incision was made overlying the L4-5 intervertebral spaces.  The fascia was incised at the midline.  The paraspinal musculature was bluntly swept laterally.  Anatomic landmarks for the pedicles were exposed. Using fluoroscopy, I did cannulate the  L4 and L5 pedicles bilaterally, using a medial to lateral cortical trajectory technique.  Bone wax was placed and the cannulated pedicle holes.  At this point, I proceeded with the decompressive aspect of the procedure.  The L4 spinous process was removed, and a bilateral partial facetectomy was performed.  In addition, there was abundant overgrowth of the ligamentum flavum bilaterally, and this was resected.  Of note, the stenosis was noted to be quite severe.  I was however able to entirely decompress the right and left lateral recess and neuroforamen.  I was very pleased with the decompression.   At this point, I turned my attention to the bilateral posterolateral fusion portion of the procedure.  Using a high-speed bur, the transverse processes of L4 and L5 were subperiosteally exposed, as were the L4-5 facet joints.  Abundant allograft in the form of Vivigen and Pliafix was packed into the posterolateral gutters and facet joints.  Autograft from the decompression was also packed into the posterolateral gutters.  At this point, I proceeded with placement of the pedicle screws.  Bilaterally, a combination of 6 and 7 mm screws were placed into the L4 and L5 pedicles.  40 mm rods were placed.  Caps were then placed and a final locking procedure was performed.  At this point, the wound was copiously irrigated using normal saline.  The wound was explored for any undue bleeding and there was no substantial bleeding encountered.  Gel-Foam was  placed over the laminectomy site.  The wound was then closed in layers using #1 Vicryl followed by 2-0 Vicryl, followed by 4-0 Monocryl.  Benzoin and Steri-Strips were applied followed by sterile dressing.     Of note, Ileana Clara was my assistant throughout surgery, and did aid in retraction, suctioning, the decompression, placement of the hardware, and closure.     Oneil Priestly, MD

## 2024-11-01 NOTE — H&P (Signed)
 "    PREOPERATIVE H&P  Chief Complaint: Bilateral leg pain  HPI: Theresa Eaton is a 74 y.o. female who presents with ongoing pain in the bilateral legs  MRI reveals spinal stenosis at L4/5, with instability  Patient has failed multiple forms of conservative care and continues to have pain (see office notes for additional details regarding the patient's full course of treatment)  Past Medical History:  Diagnosis Date   Anemia 08/06/2022   Iron  Deficiency   Arthritis    bursitis in lt. and rt.   Cataract    Had cataracts removed in 2023   Chicken pox    Diabetes mellitus without complication (HCC)    history of no longer since 2015   GERD (gastroesophageal reflux disease)    Hyperlipidemia    in the past no longer has   Osteopenia    started on fosamax  2013   Osteoporosis 2000   Will schedule a dexa scan for Nov   Pneumonia 1994   Tremor    Past Surgical History:  Procedure Laterality Date   COLONOSCOPY  10/25/2005   [place]/Normal     COLONOSCOPY WITH ESOPHAGOGASTRODUODENOSCOPY (EGD)  11/12/2022   Laser closure of greater saphenous vein, Left  2015   TUBAL LIGATION  1990?   Social History   Socioeconomic History   Marital status: Married    Spouse name: Not on file   Number of children: Not on file   Years of education: Not on file   Highest education level: Master's degree (e.g., MA, MS, MEng, MEd, MSW, MBA)  Occupational History   Not on file  Tobacco Use   Smoking status: Former    Current packs/day: 0.00    Average packs/day: 0.3 packs/day for 5.0 years (1.3 ttl pk-yrs)    Types: Cigarettes    Quit date: 04/02/1972    Years since quitting: 52.6   Smokeless tobacco: Never   Tobacco comments:    smoked in college only  Vaping Use   Vaping status: Never Used  Substance and Sexual Activity   Alcohol use: Yes    Alcohol/week: 1.0 - 2.0 standard drink of alcohol    Types: 1 - 2 Glasses of wine per week   Drug use: Never   Sexual activity: Not  Currently    Birth control/protection: Post-menopausal, None  Other Topics Concern   Not on file  Social History Narrative   MDIV, [place]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   Social Drivers of Health   Tobacco Use: Medium Risk (10/23/2024)   Patient History    Smoking Tobacco Use: Former    Smokeless Tobacco Use: Never    Passive Exposure: Not on file  Financial Resource Strain: Low Risk (09/13/2024)   Overall Financial Resource Strain (CARDIA)    Difficulty of Paying Living Expenses: Not hard at all  Food Insecurity: No Food Insecurity (09/13/2024)   Epic    Worried About Programme Researcher, Broadcasting/film/video in the Last Year: Never true    Ran Out of Food in the Last Year: Never true  Transportation Needs: No Transportation Needs (09/13/2024)   Epic    Lack of Transportation (Medical): No    Lack of Transportation (Non-Medical): No  Physical Activity: Inactive (09/13/2024)   Exercise Vital Sign    Days of Exercise per Week: 0 days    Minutes of Exercise per Session: Not on file  Stress: No  Stress Concern Present (09/13/2024)   [employer] of Occupational Health - Occupational Stress Questionnaire    Feeling of Stress: Not at all  Recent Concern: Stress - Stress Concern Present (07/31/2024)   [employer] of Occupational Health - Occupational Stress Questionnaire    Feeling of Stress: To some extent  Social Connections: Socially Integrated (09/13/2024)   Social Connection and Isolation Panel    Frequency of Communication with Friends and Family: More than three times a week    Frequency of Social Gatherings with Friends and Family: Once a week    Attends Religious Services: More than 4 times per year    Active Member of Clubs or Organizations: Yes    Attends Banker Meetings: More than 4 times per year    Marital Status: Married  Depression (PHQ2-9): Low Risk (07/31/2024)   Depression (PHQ2-9)    PHQ-2  Score: 0  Alcohol Screen: Low Risk (09/13/2024)   Alcohol Screen    Last Alcohol Screening Score (AUDIT): 2  Housing: Low Risk (09/13/2024)   Epic    Unable to Pay for Housing in the Last Year: No    Number of Times Moved in the Last Year: 0    Homeless in the Last Year: No  Utilities: Not At Risk (07/31/2024)   Epic    Threatened with loss of utilities: No  Health Literacy: Adequate Health Literacy (07/31/2024)   B1300 Health Literacy    Frequency of need for help with medical instructions: Never   Family History  Problem Relation Age of Onset   Hyperlipidemia Mother    Dementia Mother    Cancer Father        prostate   Osteoporosis Father    Tremor Father    Cancer Sister        breast   Diabetes Sister    Breast cancer Sister    Cancer Sister    Breast cancer Sister    Osteoporosis Brother    Diabetes Maternal Grandmother    Heart disease Maternal Grandfather    Heart disease Paternal Grandfather    Osteoporosis Maternal Aunt    Cancer Sister    Colon cancer Neg Hx    Colon polyps Neg Hx    Esophageal cancer Neg Hx    Stomach cancer Neg Hx    Rectal cancer Neg Hx    Parkinson's disease Neg Hx    Allergies[1] Prior to Admission medications  Medication Sig Start Date End Date Taking? Authorizing Provider  Cholecalciferol (VITAMIN D3) 50 MCG (2000 UT) capsule Take 1 capsule (2,000 Units total) by mouth daily. 08/23/22  Yes Cleatus Arlyss RAMAN, MD  Creatine 5000 MG PACK Take 5 g by mouth daily.   Yes [provider]  ipratropium (ATROVENT ) 0.06 % nasal spray Place 1-2 sprays into both nostrils 2 (two) times daily as needed for rhinitis. 09/17/24  Yes Cleatus Arlyss RAMAN, MD  Iron , Ferrous Sulfate , 325 (65 Fe) MG TABS Take 325 mg by mouth daily. 08/08/22  Yes Cleatus Arlyss RAMAN, MD  meloxicam  (MOBIC ) 15 MG tablet Take 1 tablet (15 mg total) by mouth daily. With food. 09/17/24  Yes Cleatus Arlyss RAMAN, MD  Multiple Vitamins-Minerals (CENTRUM SILVER ADULT 50+ PO) Take 1  tablet by mouth daily.   Yes [provider]  Omega-3 Fatty Acids (FISH OIL PO) Take 1 capsule by mouth daily.   Yes [provider]  primidone  (MYSOLINE ) 50 MG tablet Take 1 tablet (50 mg total) by mouth in  the morning 09/17/24  Yes Cleatus Arlyss RAMAN, MD     All other systems have been reviewed and were otherwise negative with the exception of those mentioned in the HPI and as above.  Physical Exam: Vitals:   11/01/24 0613  BP: 132/68  Pulse: 74  Resp: 17  Temp: 98.3 F (36.8 C)  SpO2: 99%    Body mass index is 29.29 kg/m.  General: Alert, no acute distress Cardiovascular: No pedal edema Respiratory: No cyanosis, no use of accessory musculature Skin: No lesions in the area of chief complaint Neurologic: Sensation intact distally Psychiatric: Patient is competent for consent with normal mood and affect Lymphatic: No axillary or cervical lymphadenopathy   Assessment/Plan: LUMBAR RADICULOPATHY DUE TO STENOSIS AND INSTABILITY AT L4/5 Plan for Procedures: POSTERIOR DECOMPRESSION AND LUMBAR FUSION 1 LEVEL   Oneil LITTIE Priestly, MD 11/01/2024 6:17 AM    [1]  Allergies Allergen Reactions   Statins     Leg weakness   "

## 2024-11-02 DIAGNOSIS — M48061 Spinal stenosis, lumbar region without neurogenic claudication: Secondary | ICD-10-CM | POA: Diagnosis not present

## 2024-11-02 MED ORDER — METHOCARBAMOL 500 MG PO TABS: 500.0000 mg | ORAL_TABLET | Freq: Four times a day (QID) | ORAL | 2 refills | Status: AC | PRN

## 2024-11-02 MED ORDER — OXYCODONE-ACETAMINOPHEN 5-325 MG PO TABS: 1.0000 | ORAL_TABLET | ORAL | 0 refills | Status: DC | PRN

## 2024-11-02 MED ORDER — OXYCODONE-ACETAMINOPHEN 5-325 MG PO TABS: 1.0000 | ORAL_TABLET | ORAL | 0 refills | Status: AC | PRN

## 2024-11-02 MED ORDER — METHOCARBAMOL 500 MG PO TABS: 500.0000 mg | ORAL_TABLET | Freq: Four times a day (QID) | ORAL | 2 refills | Status: DC | PRN

## 2024-11-02 NOTE — Care Management Obs Status (Signed)
 MEDICARE OBSERVATION STATUS NOTIFICATION   Patient Details  Name: Theresa Eaton MRN: [id number] Date of Birth: Aug 25, 1951   Medicare Observation Status Notification Given:  Yes    Jennie Laneta Dragon 11/02/2024, 10:22 AM

## 2024-11-02 NOTE — Progress Notes (Signed)
 Patient alert and oriented, mae's well, voiding adequate amount of urine, swallowing without difficulty, no c/o pain at time of discharge. Patient discharged home with husband. Script and discharged instructions given to patient. Patient stated understanding of instructions given. Room was checked and accounted for all patient's belongings; discharge instructions concerning her medications, incision care, follow up appointment and when to call the doctor as needed were all discussed with patient by RN and She expressed understanding on the instructions given.

## 2024-11-02 NOTE — Evaluation (Addendum)
 Occupational Therapy Evaluation Patient Details Name: Theresa Eaton MRN: [id number] DOB: 1951-04-08 Today's Date: 11/02/2024   History of Present Illness   74 yo F adm 1/8 s/p L4-5 PLF PMH anemia arthris DM GERD HLD Osetopenia osteoporosis tremor     Clinical Impressions Patient evaluated by Occupational Therapy with no further acute OT needs identified. All education has been completed and the patient has no further questions. See below for any follow-up Occupational Therapy or equipment needs. OT to sign off. Thank you for referral.       If plan is discharge home, recommend the following:         Functional Status Assessment   Patient has had a recent decline in their functional status and demonstrates the ability to make significant improvements in function in a reasonable and predictable amount of time.     Equipment Recommendations   None recommended by OT     Recommendations for Other Services         Precautions/Restrictions   Precautions Precautions: Back Recall of Precautions/Restrictions: Intact Required Braces or Orthoses: Spinal Brace Spinal Brace: Thoracolumbosacral orthotic;Applied in sitting position     Mobility Bed Mobility Overal bed mobility: Independent             General bed mobility comments: demonstrated with elevated height to simulate bed    Transfers Overall transfer level: Independent                 General transfer comment: completed stair transfer x3 steps      Balance Overall balance assessment: Independent                                         ADL either performed or assessed with clinical judgement   ADL Overall ADL's : Independent                                       General ADL Comments: dressed for home during session     Vision Baseline Vision/History: 1 Wears glasses Ability to See in Adequate Light: 0 Adequate Vision Assessment?: No apparent visual  deficits     Perception Perception: Within Functional Limits       Praxis         Pertinent Vitals/Pain Pain Assessment Pain Assessment: Faces Faces Pain Scale: Hurts little more Pain Location: back Pain Descriptors / Indicators: Discomfort Pain Intervention(s): Monitored during session, Premedicated before session, Repositioned     Extremity/Trunk Assessment Upper Extremity Assessment Upper Extremity Assessment: Overall WFL for tasks assessed   Lower Extremity Assessment Lower Extremity Assessment: Overall WFL for tasks assessed   Cervical / Trunk Assessment Cervical / Trunk Assessment: Back Surgery   Communication Communication Communication: No apparent difficulties   Cognition Arousal: Alert Behavior During Therapy: WFL for tasks assessed/performed Cognition: No apparent impairments                               Following commands: Intact       Cueing  General Comments          Exercises     Shoulder Instructions      Home Living Family/patient expects to be discharged to:: Private residence Living Arrangements: Spouse/significant other Available Help at Discharge: Family;Available  24 hours/day Type of Home: House Home Access: Stairs to enter [employer] of Steps: 3 with rail in R and 1 step into the house Entrance Stairs-Rails: Right Home Layout: One level     Bathroom Shower/Tub: Chief Strategy Officer: Handicapped height     Home Equipment: Agricultural Consultant (2 wheels);Cane - single point;BSC/3in1;Shower seat;Grab bars - tub/shower;Hand held shower head;Adaptive equipment;Lift chair Adaptive Equipment: Reacher;Long-handled shoe horn;Sock aid Additional Comments: spouse has mild cognitive deficits so patient has hired a caregiver to help. pt has two children that live in a nearby city. pt has a 74 yo lab      Prior Functioning/Environment Prior Level of Function : Independent/Modified Independent;Driving                     OT Problem List:     OT Treatment/Interventions:        OT Goals(Current goals can be found in the care plan section)   Acute Rehab OT Goals Potential to Achieve Goals: Good   OT Frequency:       Co-evaluation              AM-PAC OT 6 Clicks Daily Activity     Outcome Measure Help from another person eating meals?: None Help from another person taking care of personal grooming?: None Help from another person toileting, which includes using toliet, bedpan, or urinal?: None Help from another person bathing (including washing, rinsing, drying)?: None Help from another person to put on and taking off regular upper body clothing?: None Help from another person to put on and taking off regular lower body clothing?: None 6 Click Score: 24   End of Session Equipment Utilized During Treatment: Rolling walker (2 wheels);Back brace Nurse Communication: Mobility status  Activity Tolerance: Patient tolerated treatment well Patient left: in chair;with call bell/phone within reach  OT Visit Diagnosis: Unsteadiness on feet (R26.81)                Time: 9077-8998 OT Time Calculation (min): 39 min Charges:  OT General Charges $OT Visit: 1 Visit OT Evaluation $OT Eval Moderate Complexity: 1 Mod OT Treatments $Self Care/Home Management : 8-22 mins   Brynn, OTR/L  Acute Rehabilitation Services Office: [phone] .   [name] 11/02/2024, 10:08 AM

## 2024-11-02 NOTE — Progress Notes (Signed)
 PT Cancellation Note  Patient Details Name: Theresa Eaton MRN: [id number] DOB: [birth year]   Cancelled Treatment:    Reason Eval/Treat Not Completed: PT screened, no needs identified, will sign off. Discussed pt case with OT who reports pt is currently mobilizing at an independent level and does not require a formal PT evaluation at this time. PT signing off. If needs change, please reconsult.     Shimeka Bacot D Rabon Scholle 11/02/2024, 10:10 AM  Leita Sable, PT, DPT Acute Rehabilitation Services Secure Chat Preferred Office: [phone]

## 2024-11-02 NOTE — Plan of Care (Signed)
" °  Problem: Education: Goal: Knowledge of General Education information will improve Description: Including pain rating scale, medication(s)/side effects and non-pharmacologic comfort measures Outcome: Completed/Met   Problem: Health Behavior/Discharge Planning: Goal: Ability to manage health-related needs will improve Outcome: Completed/Met   Problem: Clinical Measurements: Goal: Ability to maintain clinical measurements within normal limits will improve Outcome: Completed/Met Goal: Will remain free from infection Outcome: Completed/Met Goal: Diagnostic test results will improve Outcome: Completed/Met Goal: Respiratory complications will improve Outcome: Completed/Met Goal: Cardiovascular complication will be avoided Outcome: Completed/Met   Problem: Activity: Goal: Risk for activity intolerance will decrease Outcome: Completed/Met   Problem: Nutrition: Goal: Adequate nutrition will be maintained Outcome: Completed/Met   Problem: Coping: Goal: Level of anxiety will decrease Outcome: Completed/Met   Problem: Elimination: Goal: Will not experience complications related to bowel motility Outcome: Completed/Met Goal: Will not experience complications related to urinary retention Outcome: Completed/Met   Problem: Pain Managment: Goal: General experience of comfort will improve and/or be controlled Outcome: Completed/Met   Problem: Safety: Goal: Ability to remain free from injury will improve Outcome: Completed/Met   Problem: Skin Integrity: Goal: Risk for impaired skin integrity will decrease Outcome: Completed/Met   Problem: Education: Goal: Knowledge of the prescribed therapeutic regimen will improve Outcome: Completed/Met   Problem: Bowel/Gastric: Goal: Gastrointestinal status for postoperative course will improve Outcome: Completed/Met   Problem: Cardiac: Goal: Ability to maintain an adequate cardiac output Outcome: Completed/Met Goal: Will show no  evidence of cardiac arrhythmias Outcome: Completed/Met   Problem: Nutritional: Goal: Will attain and maintain optimal nutritional status Outcome: Completed/Met   Problem: Neurological: Goal: Will regain or maintain usual level of consciousness Outcome: Completed/Met   Problem: Clinical Measurements: Goal: Ability to maintain clinical measurements within normal limits Outcome: Completed/Met Goal: Postoperative complications will be avoided or minimized Outcome: Completed/Met   Problem: Respiratory: Goal: Will regain and/or maintain adequate ventilation Outcome: Completed/Met Goal: Respiratory status will improve Outcome: Completed/Met   Problem: Skin Integrity: Goal: Demonstrates signs of wound healing without infection Outcome: Completed/Met   Problem: Urinary Elimination: Goal: Will remain free from infection Outcome: Completed/Met Goal: Ability to achieve and maintain adequate urine output Outcome: Completed/Met   Problem: Education: Goal: Ability to verbalize activity precautions or restrictions will improve Outcome: Completed/Met Goal: Knowledge of the prescribed therapeutic regimen will improve Outcome: Completed/Met Goal: Understanding of discharge needs will improve Outcome: Completed/Met   Problem: Activity: Goal: Ability to avoid complications of mobility impairment will improve Outcome: Completed/Met Goal: Ability to tolerate increased activity will improve Outcome: Completed/Met Goal: Will remain free from falls Outcome: Completed/Met   Problem: Bowel/Gastric: Goal: Gastrointestinal status for postoperative course will improve Outcome: Completed/Met   Problem: Clinical Measurements: Goal: Ability to maintain clinical measurements within normal limits will improve Outcome: Completed/Met Goal: Postoperative complications will be avoided or minimized Outcome: Completed/Met Goal: Diagnostic test results will improve Outcome: Completed/Met   Problem:  Pain Management: Goal: Pain level will decrease Outcome: Completed/Met   Problem: Skin Integrity: Goal: Will show signs of wound healing Outcome: Completed/Met   Problem: Health Behavior/Discharge Planning: Goal: Identification of resources available to assist in meeting health care needs will improve Outcome: Completed/Met   Problem: Bladder/Genitourinary: Goal: Urinary functional status for postoperative course will improve Outcome: Completed/Met   "

## 2024-11-02 NOTE — Progress Notes (Signed)
" °  Patient doing well  + LBP  Denies leg pain   Physical Exam: Vitals:   11/01/24 2237 11/02/24 0316  BP: (!) 110/58 (!) 105/56  Pulse: 86 80  Resp: 18 18  Temp: 98.5 F (36.9 C) 98.4 F (36.9 C)  SpO2: 100% 97%    Dressing in place NVI  POD #1 s/p L4/5 decompression and fusion, doing well. Patient would like to go home.  - up with PT/OT, encourage ambulation - Percocet for pain, Robaxin  for muscle spasms - d/c home after PT/OT, with f/u in 2 weeks "

## 2024-11-08 NOTE — Discharge Summary (Signed)
 "   Patient ID: Theresa Eaton MRN: [id number] DOB/AGE: 03-07-1951 74 y.o.  Admit date: 11/01/2024 Discharge date: 11/02/2024  Admission Diagnoses:  Principal Problem:   Spinal stenosis of lumbar region   Discharge Diagnoses:  Same  Past Medical History:  Diagnosis Date   Anemia 08/06/2022   Iron  Deficiency   Arthritis    bursitis in lt. and rt.   Cataract    Had cataracts removed in 2023   Chicken pox    Diabetes mellitus without complication (HCC)    history of no longer since 2015   GERD (gastroesophageal reflux disease)    Hyperlipidemia    in the past no longer has   Osteopenia    started on fosamax  2013   Osteoporosis 2000   Will schedule a dexa scan for Nov   Pneumonia 1994   Tremor     Surgeries: Procedures: POSTERIOR LUMBAR FUSION 1 LEVEL on 11/01/2024   Consultants: None  Discharged Condition: Improved  Hospital Course: OURANIA HAMLER is an 74 y.o. female who was admitted 11/01/2024 for operative treatment of Spinal stenosis of lumbar region. Patient has severe unremitting pain that affects sleep, daily activities, and work/hobbies. After pre-op clearance the patient was taken to the operating room on 11/01/2024 and underwent  Procedures: POSTERIOR LUMBAR FUSION 1 LEVEL.    Patient was given perioperative antibiotics:  Anti-infectives (From admission, onward)    Start     Dose/Rate Route Frequency Ordered Stop   11/01/24 1530  ceFAZolin  (ANCEF ) IVPB 2g/100 mL premix        2 g 200 mL/hr over 30 Minutes Intravenous Every 8 hours 11/01/24 1252 11/02/24 0827   11/01/24 0600  ceFAZolin  (ANCEF ) IVPB 2g/100 mL premix        2 g 200 mL/hr over 30 Minutes Intravenous On call to O.R. 11/01/24 0558 11/01/24 0730        Patient was given sequential compression devices, early ambulation to prevent DVT.  Patient benefited maximally from hospital stay and there were no complications.    Recent vital signs: BP (!) 110/59 (BP Location: Left Arm)   Pulse 72   Temp  98.6 F (37 C) (Oral)   Resp 18   Ht 5' 5 (1.651 m)   Wt 79.8 kg   SpO2 100%   BMI 29.29 kg/m    Discharge Medications:   Allergies as of 11/02/2024       Reactions   Statins    Leg weakness        Medication List     TAKE these medications    CENTRUM SILVER ADULT 50+ PO Take 1 tablet by mouth daily.   Creatine 5000 MG Pack Take 5 g by mouth daily.   FISH OIL PO Take 1 capsule by mouth daily.   ipratropium 0.06 % nasal spray Commonly known as: ATROVENT  Place 1-2 sprays into both nostrils 2 (two) times daily as needed for rhinitis.   Iron  (Ferrous Sulfate ) 325 (65 Fe) MG Tabs Take 325 mg by mouth daily.   methocarbamol  500 MG tablet Commonly known as: ROBAXIN  Take 1 tablet (500 mg total) by mouth every 6 (six) hours as needed for muscle spasms.   oxyCODONE -acetaminophen  5-325 MG tablet Commonly known as: PERCOCET/ROXICET Take 1-2 tablets by mouth every 4 (four) hours as needed for severe pain (pain score 7-10).   primidone  50 MG tablet Commonly known as: MYSOLINE  Take 1 tablet (50 mg total) by mouth in the morning   Vitamin D3 50  MCG (2000 UT) capsule Take 1 capsule (2,000 Units total) by mouth daily.        Diagnostic Studies: DG Lumbar Spine 2-3 Views Result Date: 11/01/2024 CLINICAL DATA:  Elective surgery.  L4-L5 fusion. EXAM: LUMBAR SPINE - 2-3 VIEW COMPARISON:  11/01/2026 FINDINGS: Fluoroscopic images demonstrate bilateral pedicle screw and rod fixation at L4 and L5. Vertebral body height loss at L5 compatible with previous fracture. Radiation Exposure Index (as provided by the fluoroscopic device): 17.29 mGy Kerma IMPRESSION: Posterior fusion at L4-L5. Electronically Signed   By: Juliene Balder M.D.   On: 11/01/2024 10:46   DG Lumbar Spine 1 View Result Date: 11/01/2024 CLINICAL DATA:  Elective surgery.  Spinal localization. EXAM: LUMBAR SPINE - 1 VIEW COMPARISON:  None Available. FINDINGS: Cross-table lateral view of the lumbar spine was obtained. There  is one surgical marker at the level of L3. There is a second marker at the level of L4-L5. IMPRESSION: Surgical localization as described. Electronically Signed   By: Juliene Balder M.D.   On: 11/01/2024 10:44   DG C-Arm 1-60 Min-No Report Result Date: 11/01/2024 Fluoroscopy was utilized by the requesting physician.  No radiographic interpretation.   DG C-Arm 1-60 Min-No Report Result Date: 11/01/2024 Fluoroscopy was utilized by the requesting physician.  No radiographic interpretation.    Disposition: Discharge disposition: 01-Home or Self Care        POD #1 s/p L4/5 decompression and fusion, doing well. Patient would like to go home.   - up with PT/OT, encourage ambulation - Percocet for pain, Robaxin  for muscle spasms -Scripts for pain sent to pharmacy electronically  -D/C instructions sheet printed and in chart -D/C today  -F/U in office 2 weeks   Signed: Lorelei Heikkila J Ladiamond Gallina 11/08/2024, 3:23 PM       "

## 2024-11-12 ENCOUNTER — Encounter: Payer: Self-pay | Admitting: Family Medicine

## 2024-11-12 DIAGNOSIS — M8580 Other specified disorders of bone density and structure, unspecified site: Secondary | ICD-10-CM

## 2024-12-13 ENCOUNTER — Encounter: Admitting: Physician Assistant

## 2025-08-20 ENCOUNTER — Ambulatory Visit

## 2025-08-21 ENCOUNTER — Ambulatory Visit
